# Patient Record
Sex: Female | Born: 1955
Health system: Southern US, Community
[De-identification: ages and names within clinical notes are randomized; demographics above are authoritative.]

## PROBLEM LIST (undated history)

## (undated) DIAGNOSIS — E039 Hypothyroidism, unspecified: Secondary | ICD-10-CM

## (undated) DIAGNOSIS — I10 Essential (primary) hypertension: Secondary | ICD-10-CM

## (undated) SURGERY — Surgical Case
Anesthesia: *Unknown

---

## 1999-04-14 ENCOUNTER — Encounter: Payer: Self-pay | Admitting: Obstetrics and Gynecology

## 1999-04-14 ENCOUNTER — Ambulatory Visit (HOSPITAL_COMMUNITY): Admission: RE | Admit: 1999-04-14 | Discharge: 1999-04-14 | Payer: Self-pay | Admitting: Obstetrics and Gynecology

## 1999-05-04 ENCOUNTER — Other Ambulatory Visit: Admission: RE | Admit: 1999-05-04 | Discharge: 1999-05-04 | Payer: Self-pay | Admitting: Obstetrics and Gynecology

## 2000-05-19 ENCOUNTER — Ambulatory Visit (HOSPITAL_COMMUNITY): Admission: RE | Admit: 2000-05-19 | Discharge: 2000-05-19 | Payer: Self-pay | Admitting: Obstetrics and Gynecology

## 2000-05-19 ENCOUNTER — Encounter: Payer: Self-pay | Admitting: Obstetrics and Gynecology

## 2000-06-08 ENCOUNTER — Other Ambulatory Visit: Admission: RE | Admit: 2000-06-08 | Discharge: 2000-06-08 | Payer: Self-pay | Admitting: Obstetrics and Gynecology

## 2002-05-02 ENCOUNTER — Ambulatory Visit (HOSPITAL_COMMUNITY): Admission: RE | Admit: 2002-05-02 | Discharge: 2002-05-02 | Payer: Self-pay | Admitting: Obstetrics and Gynecology

## 2002-05-02 ENCOUNTER — Encounter: Payer: Self-pay | Admitting: Obstetrics and Gynecology

## 2002-05-14 ENCOUNTER — Other Ambulatory Visit: Admission: RE | Admit: 2002-05-14 | Discharge: 2002-05-14 | Payer: Self-pay | Admitting: Obstetrics & Gynecology

## 2003-05-16 ENCOUNTER — Other Ambulatory Visit: Admission: RE | Admit: 2003-05-16 | Discharge: 2003-05-16 | Payer: Self-pay | Admitting: Obstetrics & Gynecology

## 2003-05-16 ENCOUNTER — Ambulatory Visit (HOSPITAL_COMMUNITY): Admission: RE | Admit: 2003-05-16 | Discharge: 2003-05-16 | Payer: Self-pay | Admitting: Obstetrics and Gynecology

## 2005-05-10 ENCOUNTER — Ambulatory Visit (HOSPITAL_COMMUNITY): Admission: RE | Admit: 2005-05-10 | Discharge: 2005-05-10 | Payer: Self-pay | Admitting: Obstetrics and Gynecology

## 2006-05-15 ENCOUNTER — Ambulatory Visit (HOSPITAL_COMMUNITY): Admission: RE | Admit: 2006-05-15 | Discharge: 2006-05-15 | Payer: Self-pay | Admitting: Obstetrics and Gynecology

## 2006-11-28 ENCOUNTER — Ambulatory Visit (HOSPITAL_COMMUNITY): Admission: RE | Admit: 2006-11-28 | Discharge: 2006-11-28 | Payer: Self-pay | Admitting: Family Medicine

## 2007-06-26 ENCOUNTER — Ambulatory Visit (HOSPITAL_COMMUNITY): Admission: RE | Admit: 2007-06-26 | Discharge: 2007-06-26 | Payer: Self-pay | Admitting: Obstetrics and Gynecology

## 2008-06-30 ENCOUNTER — Ambulatory Visit (HOSPITAL_COMMUNITY): Admission: RE | Admit: 2008-06-30 | Discharge: 2008-06-30 | Payer: Self-pay | Admitting: Obstetrics and Gynecology

## 2009-07-01 ENCOUNTER — Ambulatory Visit (HOSPITAL_COMMUNITY): Admission: RE | Admit: 2009-07-01 | Discharge: 2009-07-01 | Payer: Self-pay | Admitting: Obstetrics and Gynecology

## 2010-06-14 ENCOUNTER — Other Ambulatory Visit (HOSPITAL_COMMUNITY): Payer: Self-pay | Admitting: Obstetrics and Gynecology

## 2010-06-14 DIAGNOSIS — Z1231 Encounter for screening mammogram for malignant neoplasm of breast: Secondary | ICD-10-CM

## 2010-07-05 ENCOUNTER — Ambulatory Visit (HOSPITAL_COMMUNITY)
Admission: RE | Admit: 2010-07-05 | Discharge: 2010-07-05 | Disposition: A | Discharge status: Home / Self Care | Payer: 59 | Source: Ambulatory Visit | Attending: Obstetrics and Gynecology | Admitting: Obstetrics and Gynecology

## 2010-07-05 DIAGNOSIS — Z1231 Encounter for screening mammogram for malignant neoplasm of breast: Secondary | ICD-10-CM

## 2011-07-19 ENCOUNTER — Other Ambulatory Visit (HOSPITAL_COMMUNITY): Payer: Self-pay | Admitting: Obstetrics and Gynecology

## 2011-07-19 DIAGNOSIS — Z1231 Encounter for screening mammogram for malignant neoplasm of breast: Secondary | ICD-10-CM

## 2011-08-12 ENCOUNTER — Ambulatory Visit (HOSPITAL_COMMUNITY)
Admission: RE | Admit: 2011-08-12 | Discharge: 2011-08-12 | Disposition: A | Discharge status: Home / Self Care | Payer: 59 | Source: Ambulatory Visit | Attending: Obstetrics and Gynecology | Admitting: Obstetrics and Gynecology

## 2011-08-12 DIAGNOSIS — Z1231 Encounter for screening mammogram for malignant neoplasm of breast: Secondary | ICD-10-CM

## 2012-11-13 ENCOUNTER — Other Ambulatory Visit (HOSPITAL_COMMUNITY): Payer: Self-pay | Admitting: Obstetrics and Gynecology

## 2012-11-13 DIAGNOSIS — Z1231 Encounter for screening mammogram for malignant neoplasm of breast: Secondary | ICD-10-CM

## 2012-11-30 ENCOUNTER — Ambulatory Visit (HOSPITAL_COMMUNITY): Payer: 59

## 2012-12-03 ENCOUNTER — Ambulatory Visit (HOSPITAL_COMMUNITY)
Admission: RE | Admit: 2012-12-03 | Discharge: 2012-12-03 | Disposition: A | Discharge status: Home / Self Care | Payer: 59 | Source: Ambulatory Visit | Attending: Obstetrics and Gynecology | Admitting: Obstetrics and Gynecology

## 2012-12-03 DIAGNOSIS — Z1231 Encounter for screening mammogram for malignant neoplasm of breast: Secondary | ICD-10-CM

## 2013-02-18 ENCOUNTER — Other Ambulatory Visit: Payer: Self-pay | Admitting: Nurse Practitioner

## 2013-02-28 ENCOUNTER — Other Ambulatory Visit (HOSPITAL_COMMUNITY): Payer: Self-pay | Admitting: Obstetrics and Gynecology

## 2013-02-28 DIAGNOSIS — N951 Menopausal and female climacteric states: Secondary | ICD-10-CM

## 2013-03-07 ENCOUNTER — Ambulatory Visit (INDEPENDENT_AMBULATORY_CARE_PROVIDER_SITE_OTHER): Payer: 59 | Admitting: Nurse Practitioner

## 2013-03-07 ENCOUNTER — Encounter: Payer: Self-pay | Admitting: Nurse Practitioner

## 2013-03-07 VITALS — BP 122/82 | Ht 64.5 in | Wt 236.0 lb

## 2013-03-07 DIAGNOSIS — S40869A Insect bite (nonvenomous) of unspecified upper arm, initial encounter: Secondary | ICD-10-CM

## 2013-03-07 DIAGNOSIS — E039 Hypothyroidism, unspecified: Secondary | ICD-10-CM

## 2013-03-07 DIAGNOSIS — W57XXXA Bitten or stung by nonvenomous insect and other nonvenomous arthropods, initial encounter: Secondary | ICD-10-CM

## 2013-03-07 DIAGNOSIS — R609 Edema, unspecified: Secondary | ICD-10-CM

## 2013-03-07 DIAGNOSIS — Z0000 Encounter for general adult medical examination without abnormal findings: Secondary | ICD-10-CM

## 2013-03-07 MED ORDER — HYDROCHLOROTHIAZIDE 25 MG PO TABS: 25.0000 mg | ORAL_TABLET | Freq: Every day | ORAL | Status: DC

## 2013-03-07 MED ORDER — CLOBETASOL PROPIONATE 0.05 % EX CREA: 1.0000 "application " | TOPICAL_CREAM | Freq: Two times a day (BID) | CUTANEOUS | Status: DC

## 2013-03-08 ENCOUNTER — Ambulatory Visit (HOSPITAL_COMMUNITY)
Admission: RE | Admit: 2013-03-08 | Discharge: 2013-03-08 | Disposition: A | Discharge status: Home / Self Care | Payer: 59 | Source: Ambulatory Visit | Attending: Obstetrics and Gynecology | Admitting: Obstetrics and Gynecology

## 2013-03-08 DIAGNOSIS — Z13820 Encounter for screening for osteoporosis: Secondary | ICD-10-CM | POA: Insufficient documentation

## 2013-03-08 DIAGNOSIS — Z780 Asymptomatic menopausal state: Secondary | ICD-10-CM | POA: Insufficient documentation

## 2013-03-08 DIAGNOSIS — N951 Menopausal and female climacteric states: Secondary | ICD-10-CM

## 2013-03-11 ENCOUNTER — Encounter: Payer: Self-pay | Admitting: Nurse Practitioner

## 2013-03-11 LAB — TSH: TSH: 2.738 u[IU]/mL (ref 0.350–4.500)

## 2013-03-11 LAB — HEPATIC FUNCTION PANEL
ALK PHOS: 53 U/L (ref 39–117)
ALT: 19 U/L (ref 0–35)
AST: 20 U/L (ref 0–37)
Albumin: 4.1 g/dL (ref 3.5–5.2)
BILIRUBIN INDIRECT: 0.3 mg/dL (ref 0.2–1.2)
Bilirubin, Direct: 0.1 mg/dL (ref 0.0–0.3)
Total Bilirubin: 0.4 mg/dL (ref 0.2–1.2)
Total Protein: 6.4 g/dL (ref 6.0–8.3)

## 2013-03-11 LAB — BASIC METABOLIC PANEL
BUN: 16 mg/dL (ref 6–23)
CALCIUM: 9.2 mg/dL (ref 8.4–10.5)
CHLORIDE: 109 meq/L (ref 96–112)
CO2: 28 meq/L (ref 19–32)
CREATININE: 0.71 mg/dL (ref 0.50–1.10)
GLUCOSE: 99 mg/dL (ref 70–99)
POTASSIUM: 4.6 meq/L (ref 3.5–5.3)
Sodium: 143 mEq/L (ref 135–145)

## 2013-03-11 LAB — LIPID PANEL
CHOL/HDL RATIO: 2.9 ratio
Cholesterol: 153 mg/dL (ref 0–200)
HDL: 52 mg/dL (ref >39)
LDL CALC: 90 mg/dL (ref 0–99)
Triglycerides: 54 mg/dL (ref <150)
VLDL: 11 mg/dL (ref 0–40)

## 2013-03-11 NOTE — Progress Notes (Signed)
Subjective:  Presents for recheck. Gets PE with GYN. Has joined TOPS and a weight loss program at [facility]. Slight edema in the ankles at times. Has taken some old Lasix that she has. No CP, SOB or orthopnea. No cough. Also at end of visit mentions small pruritic area as site of previous tick bite, improved. No fever, headache or other rash.  Objective:   BP 122/82  Ht 5' 4.5" (1.638 m)  Wt 236 lb (107.049 kg)  BMI 39.90 kg/m2  LMP 06/28/2010 NAD. Alert, oriented. Lungs clear. Heart RRR. Lower extremities trace pitting edema. Slightly raised mildly erythematous nontender area at left posterior axillary line.  Assessment:  Problem List Items Addressed This Visit     Endocrine   Hypothyroidism - Primary   Relevant Orders      TSH     Other   Morbid obesity    Other Visit Diagnoses   Peripheral edema        Routine general medical examination at a health care facility        Relevant Orders       Basic metabolic panel       Hepatic function panel       Lipid panel    Tick bite of axillary region            Meds ordered this encounter  Medications  . DISCONTD: metroNIDAZOLE (FLAGYL) 500 MG tablet    Sig:   . FEMHRT LOW DOSE 0.5-2.5 MG-MCG per tablet    Sig:   . DISCONTD: nystatin-triamcinolone ointment (MYCOLOG)    Sig:   . Omega-3 Fatty Acids (FISH OIL PO)    Sig: Take by mouth daily.  . Cyanocobalamin (VITAMIN B 12 PO)    Sig: Take by mouth daily.  Marland Kitchen OVER THE COUNTER MEDICATION    Sig: Vitamin D one daily  . naproxen sodium (ANAPROX) 220 MG tablet    Sig: Take 220 mg by mouth 2 (two) times daily with a meal.  . aspirin 81 MG tablet    Sig: Take 81 mg by mouth daily.  . hydrochlorothiazide (HYDRODIURIL) 25 MG tablet    Sig: Take 1 tablet (25 mg total) by mouth daily. Prn swelling    Dispense:  90 tablet    Refill:  1    Order Specific Question:  Supervising Provider    Answer:  Mikey Kirschner [[id number]]  . clobetasol cream (TEMOVATE) 0.05 %    Sig: Apply 1  application topically 2 (two) times daily. Prn itchy rash up to 2 weeks at a time    Dispense:  30 g    Refill:  0    Order Specific Question:  Supervising Provider    Answer:  Mikey Kirschner [[id number]]  continue weight loss efforts. Low sodium diet. HCTZ as directed prn edema. Warning signs of tick fever reviewed.  Return in about 6 months (around 09/04/2013). Call back sooner if any problems.

## 2013-03-16 ENCOUNTER — Encounter: Payer: Self-pay | Admitting: Nurse Practitioner

## 2013-05-17 ENCOUNTER — Other Ambulatory Visit: Payer: Self-pay | Admitting: Family Medicine

## 2013-11-19 ENCOUNTER — Other Ambulatory Visit (HOSPITAL_COMMUNITY): Payer: Self-pay | Admitting: Obstetrics and Gynecology

## 2013-11-19 DIAGNOSIS — Z1231 Encounter for screening mammogram for malignant neoplasm of breast: Secondary | ICD-10-CM

## 2013-11-28 ENCOUNTER — Encounter: Payer: Self-pay | Admitting: Nurse Practitioner

## 2013-11-28 ENCOUNTER — Ambulatory Visit (INDEPENDENT_AMBULATORY_CARE_PROVIDER_SITE_OTHER): Payer: 59 | Admitting: Nurse Practitioner

## 2013-11-28 VITALS — BP 128/84 | Ht 64.5 in | Wt 242.2 lb

## 2013-11-28 DIAGNOSIS — E039 Hypothyroidism, unspecified: Secondary | ICD-10-CM

## 2013-11-28 DIAGNOSIS — R609 Edema, unspecified: Secondary | ICD-10-CM

## 2013-11-28 MED ORDER — HYDROCHLOROTHIAZIDE 25 MG PO TABS: 25.0000 mg | ORAL_TABLET | Freq: Every day | ORAL | Status: DC

## 2013-11-28 MED ORDER — LEVOTHYROXINE SODIUM 75 MCG PO TABS: ORAL_TABLET | ORAL | Status: DC

## 2013-11-29 ENCOUNTER — Encounter: Payer: Self-pay | Admitting: Nurse Practitioner

## 2013-11-29 NOTE — Progress Notes (Signed)
Subjective:  Presents for routine follow-up. Has been receiving physical therapy on her knee. Now riding a stationary bike about an hour a day. Having swelling in the lower legs about once every 2 weeks. Takes her fluid pill which helps. Gets regular preventive health physicals. Just had her TSH repeated with other blood work, not unavailable during office visit.  Objective:   BP 128/84 mmHg  Ht 5' 4.5" (1.638 m)  Wt 242 lb 3.2 oz (109.861 kg)  BMI 40.95 kg/m2  LMP 06/28/2010 NAD. Alert, oriented. Thyroid normal limit to palpation, no mass goiter or tenderness noted. Lungs clear. Heart regular rate rhythm. Lower extremities no edema.  Assessment:  Problem List Items Addressed This Visit      Endocrine   Hypothyroidism - Primary   Relevant Medications      levothyroxine (SYNTHROID, LEVOTHROID) tablet    Other Visit Diagnoses    Peripheral edema            Plan:  Meds ordered this encounter  Medications  . levothyroxine (SYNTHROID, LEVOTHROID) 75 MCG tablet    Sig: take 1 tablet by mouth once daily    Dispense:  90 tablet    Refill:  1    Order Specific Question:  Supervising Provider    Answer:  Mikey Kirschner [[id number]]  . hydrochlorothiazide (HYDRODIURIL) 25 MG tablet    Sig: Take 1 tablet (25 mg total) by mouth daily. Prn swelling    Dispense:  90 tablet    Refill:  0    Order Specific Question:  Supervising Provider    Answer:  Mikey Kirschner [[id number]]   Continue regular exercise and weight loss efforts. Return in about 1 year (around 11/29/2014).

## 2013-12-01 ENCOUNTER — Other Ambulatory Visit: Payer: Self-pay | Admitting: Family Medicine

## 2013-12-04 ENCOUNTER — Ambulatory Visit (HOSPITAL_COMMUNITY)
Admission: RE | Admit: 2013-12-04 | Discharge: 2013-12-04 | Disposition: A | Discharge status: Home / Self Care | Payer: 59 | Source: Ambulatory Visit | Attending: Obstetrics and Gynecology | Admitting: Obstetrics and Gynecology

## 2013-12-04 DIAGNOSIS — Z1231 Encounter for screening mammogram for malignant neoplasm of breast: Secondary | ICD-10-CM

## 2014-12-07 ENCOUNTER — Other Ambulatory Visit: Payer: Self-pay | Admitting: Family Medicine

## 2014-12-08 NOTE — Telephone Encounter (Signed)
Last office visit November 2015. May I refill

## 2014-12-08 NOTE — Telephone Encounter (Signed)
1 refill needs office visit 

## 2014-12-11 ENCOUNTER — Other Ambulatory Visit: Payer: Self-pay | Admitting: Nurse Practitioner

## 2014-12-11 ENCOUNTER — Other Ambulatory Visit: Payer: Self-pay | Admitting: Family Medicine

## 2014-12-11 ENCOUNTER — Other Ambulatory Visit: Payer: Self-pay | Admitting: *Deleted

## 2014-12-11 MED ORDER — HYDROCHLOROTHIAZIDE 25 MG PO TABS: 25.0000 mg | ORAL_TABLET | Freq: Every day | ORAL | Status: DC

## 2015-01-22 ENCOUNTER — Other Ambulatory Visit: Payer: Self-pay

## 2015-01-22 DIAGNOSIS — Z1231 Encounter for screening mammogram for malignant neoplasm of breast: Secondary | ICD-10-CM

## 2015-02-16 ENCOUNTER — Ambulatory Visit
Admission: RE | Admit: 2015-02-16 | Discharge: 2015-02-16 | Disposition: A | Discharge status: Home / Self Care | Payer: 59 | Source: Ambulatory Visit

## 2015-02-16 DIAGNOSIS — Z1231 Encounter for screening mammogram for malignant neoplasm of breast: Secondary | ICD-10-CM

## 2015-04-05 ENCOUNTER — Other Ambulatory Visit: Payer: Self-pay | Admitting: Family Medicine

## 2015-04-06 NOTE — Telephone Encounter (Signed)
30 day prescription needs office visit 

## 2015-04-29 ENCOUNTER — Encounter: Payer: Self-pay | Admitting: Nurse Practitioner

## 2015-04-29 ENCOUNTER — Ambulatory Visit (INDEPENDENT_AMBULATORY_CARE_PROVIDER_SITE_OTHER): Payer: Commercial Managed Care - HMO | Admitting: Nurse Practitioner

## 2015-04-29 VITALS — BP 122/74 | Ht 64.0 in | Wt 229.0 lb

## 2015-04-29 DIAGNOSIS — E039 Hypothyroidism, unspecified: Secondary | ICD-10-CM

## 2015-04-29 NOTE — Progress Notes (Signed)
Subjective:  Presents for recheck on her thyroid. Just had her preventive health physical. Compliant with medication. Had blood work done through another provider back in October. States it was all normal. Will get [place] a copy.  Objective:   BP 122/74 mmHg  Ht 5\' 4"  (1.626 m)  Wt 229 lb (103.874 kg)  BMI 39.29 kg/m2  LMP 06/28/2010 NAD. Alert, oriented. Lungs clear. Heart regular rate rhythm. Thyroid no masses or goiter noted, nontender to palpation.  Assessment:  Problem List Items Addressed This Visit      Endocrine   Hypothyroidism - Primary   Relevant Orders   TSH     Plan: Further refills on our medication based on test results. Return in about 1 year (around 04/28/2016) for recheck thyroid.

## 2015-04-30 ENCOUNTER — Other Ambulatory Visit: Payer: Self-pay | Admitting: Nurse Practitioner

## 2015-04-30 LAB — TSH: TSH: 2.08 u[IU]/mL (ref 0.450–4.500)

## 2015-04-30 MED ORDER — LEVOTHYROXINE SODIUM 75 MCG PO TABS: ORAL_TABLET | ORAL | Status: DC

## 2015-05-12 ENCOUNTER — Other Ambulatory Visit: Payer: Self-pay | Admitting: Obstetrics and Gynecology

## 2015-05-12 DIAGNOSIS — N951 Menopausal and female climacteric states: Secondary | ICD-10-CM

## 2015-06-19 ENCOUNTER — Ambulatory Visit
Admission: RE | Admit: 2015-06-19 | Discharge: 2015-06-19 | Disposition: A | Discharge status: Home / Self Care | Payer: 59 | Source: Ambulatory Visit | Attending: Obstetrics and Gynecology | Admitting: Obstetrics and Gynecology

## 2015-06-19 DIAGNOSIS — N951 Menopausal and female climacteric states: Secondary | ICD-10-CM

## 2016-02-02 DIAGNOSIS — M722 Plantar fascial fibromatosis: Secondary | ICD-10-CM | POA: Diagnosis not present

## 2016-02-04 DIAGNOSIS — M722 Plantar fascial fibromatosis: Secondary | ICD-10-CM | POA: Diagnosis not present

## 2016-02-08 DIAGNOSIS — M722 Plantar fascial fibromatosis: Secondary | ICD-10-CM | POA: Diagnosis not present

## 2016-02-12 DIAGNOSIS — M722 Plantar fascial fibromatosis: Secondary | ICD-10-CM | POA: Diagnosis not present

## 2016-02-15 DIAGNOSIS — M722 Plantar fascial fibromatosis: Secondary | ICD-10-CM | POA: Diagnosis not present

## 2016-02-18 DIAGNOSIS — M722 Plantar fascial fibromatosis: Secondary | ICD-10-CM | POA: Diagnosis not present

## 2016-05-04 ENCOUNTER — Telehealth: Payer: Self-pay | Admitting: Family Medicine

## 2016-05-04 NOTE — Telephone Encounter (Signed)
Patient is requesting order for blood work for appointment on 05/16/16 with Hoyle Sauer.

## 2016-05-09 ENCOUNTER — Other Ambulatory Visit: Payer: Self-pay | Admitting: Nurse Practitioner

## 2016-05-09 DIAGNOSIS — Z13220 Encounter for screening for lipoid disorders: Secondary | ICD-10-CM

## 2016-05-09 DIAGNOSIS — R5383 Other fatigue: Secondary | ICD-10-CM

## 2016-05-09 DIAGNOSIS — E039 Hypothyroidism, unspecified: Secondary | ICD-10-CM

## 2016-05-09 NOTE — Telephone Encounter (Signed)
Left message return call 05/09/2016

## 2016-05-09 NOTE — Telephone Encounter (Signed)
Labs ordered.

## 2016-05-09 NOTE — Telephone Encounter (Signed)
Spoke with patient and informed her per [place] have been ordered for upcoming appointment. Patient verbalized understanding.

## 2016-05-10 DIAGNOSIS — Z13220 Encounter for screening for lipoid disorders: Secondary | ICD-10-CM | POA: Diagnosis not present

## 2016-05-10 DIAGNOSIS — R5383 Other fatigue: Secondary | ICD-10-CM | POA: Diagnosis not present

## 2016-05-10 DIAGNOSIS — E039 Hypothyroidism, unspecified: Secondary | ICD-10-CM | POA: Diagnosis not present

## 2016-05-11 ENCOUNTER — Other Ambulatory Visit: Payer: Self-pay | Admitting: Nurse Practitioner

## 2016-05-11 LAB — BASIC METABOLIC PANEL
BUN/Creatinine Ratio: 21 (ref 12–28)
BUN: 15 mg/dL (ref 8–27)
CO2: 25 mmol/L (ref 18–29)
CREATININE: 0.71 mg/dL (ref 0.57–1.00)
Calcium: 9.2 mg/dL (ref 8.7–10.3)
Chloride: 101 mmol/L (ref 96–106)
GFR calc Af Amer: 107 mL/min/{1.73_m2} (ref >59)
GFR, EST NON AFRICAN AMERICAN: 93 mL/min/{1.73_m2} (ref >59)
Glucose: 74 mg/dL (ref 65–99)
POTASSIUM: 4.4 mmol/L (ref 3.5–5.2)
Sodium: 139 mmol/L (ref 134–144)

## 2016-05-11 LAB — LIPID PANEL
CHOL/HDL RATIO: 2.7 ratio (ref 0.0–4.4)
Cholesterol, Total: 179 mg/dL (ref 100–199)
HDL: 67 mg/dL (ref >39)
LDL CALC: 101 mg/dL — AB (ref 0–99)
TRIGLYCERIDES: 55 mg/dL (ref 0–149)
VLDL CHOLESTEROL CAL: 11 mg/dL (ref 5–40)

## 2016-05-11 LAB — HEPATIC FUNCTION PANEL
ALBUMIN: 4.5 g/dL (ref 3.6–4.8)
ALK PHOS: 65 IU/L (ref 39–117)
ALT: 21 IU/L (ref 0–32)
AST: 24 IU/L (ref 0–40)
BILIRUBIN, DIRECT: 0.14 mg/dL (ref 0.00–0.40)
Bilirubin Total: 0.5 mg/dL (ref 0.0–1.2)
TOTAL PROTEIN: 6.8 g/dL (ref 6.0–8.5)

## 2016-05-11 LAB — TSH: TSH: 2.13 u[IU]/mL (ref 0.450–4.500)

## 2016-05-11 LAB — VITAMIN D 25 HYDROXY (VIT D DEFICIENCY, FRACTURES): Vit D, 25-Hydroxy: 40 ng/mL (ref 30.0–100.0)

## 2016-05-16 ENCOUNTER — Encounter: Payer: Self-pay | Admitting: Nurse Practitioner

## 2016-05-16 ENCOUNTER — Ambulatory Visit (INDEPENDENT_AMBULATORY_CARE_PROVIDER_SITE_OTHER): Payer: Commercial Managed Care - HMO | Admitting: Nurse Practitioner

## 2016-05-16 VITALS — BP 128/82 | Ht 64.0 in | Wt 249.8 lb

## 2016-05-16 DIAGNOSIS — E039 Hypothyroidism, unspecified: Secondary | ICD-10-CM | POA: Diagnosis not present

## 2016-05-16 MED ORDER — HYDROCHLOROTHIAZIDE 25 MG PO TABS: 25.0000 mg | ORAL_TABLET | Freq: Every day | ORAL | 1 refills | Status: DC

## 2016-05-16 MED ORDER — LEVOTHYROXINE SODIUM 75 MCG PO TABS: 75.0000 ug | ORAL_TABLET | Freq: Every day | ORAL | 3 refills | Status: DC

## 2016-05-16 NOTE — Progress Notes (Signed)
Subjective:  Presents for recheck on her thyroid. Compliant with medication. Also needs to review her labs. Limited activity and weight gain over the past few months which she relates to her brother's illness and death in 2023-02-18 and severe plantar fasciitis which is finally improving. Does ride her stationary bike which helps knee pain. Gets regular preventive health physicals and mammograms at [place]. Takes an occasional HCTZ only for edema.   Objective:   BP 128/82   Ht 5\' 4"  (1.626 m)   Wt 249 lb 12.8 oz (113.3 kg)   LMP 06/28/2010   BMI 42.88 kg/m  NAD. Alert, oriented. Lungs clear. Heart RRR. Thyroid nontender to palpation; no obvious masses.  Reviewed labs with patient from 05/10/16.  Assessment:   Problem List Items Addressed This Visit      Endocrine   Hypothyroidism - Primary   Relevant Medications   levothyroxine (SYNTHROID, LEVOTHROID) 75 MCG tablet       Plan:   Meds ordered this encounter  Medications  . levothyroxine (SYNTHROID, LEVOTHROID) 75 MCG tablet    Sig: Take 1 tablet (75 mcg total) by mouth daily.    Dispense:  90 tablet    Refill:  3    Order Specific Question:   Supervising Provider    Answer:   Mikey Kirschner [[id number]]  . hydrochlorothiazide (HYDRODIURIL) 25 MG tablet    Sig: Take 1 tablet (25 mg total) by mouth daily. Prn swelling    Dispense:  90 tablet    Refill:  1    Order Specific Question:   Supervising Provider    Answer:   Mikey Kirschner [[id number]]   Continue current medications. Encouraged healthy diet, regular activity and weight loss. Continue daily vitamin D.  Return in about 6 months (around 11/15/2016) for recheck.

## 2016-10-14 DIAGNOSIS — M25561 Pain in right knee: Secondary | ICD-10-CM | POA: Diagnosis not present

## 2016-11-14 ENCOUNTER — Ambulatory Visit: Payer: Commercial Managed Care - HMO | Admitting: Nurse Practitioner

## 2017-01-25 ENCOUNTER — Other Ambulatory Visit: Payer: Self-pay | Admitting: Obstetrics and Gynecology

## 2017-01-25 DIAGNOSIS — Z1231 Encounter for screening mammogram for malignant neoplasm of breast: Secondary | ICD-10-CM

## 2017-01-30 DIAGNOSIS — M25561 Pain in right knee: Secondary | ICD-10-CM | POA: Diagnosis not present

## 2017-01-30 DIAGNOSIS — M21061 Valgus deformity, not elsewhere classified, right knee: Secondary | ICD-10-CM | POA: Diagnosis not present

## 2017-01-30 DIAGNOSIS — M1711 Unilateral primary osteoarthritis, right knee: Secondary | ICD-10-CM | POA: Diagnosis not present

## 2017-02-07 DIAGNOSIS — M1711 Unilateral primary osteoarthritis, right knee: Secondary | ICD-10-CM | POA: Diagnosis not present

## 2017-02-07 DIAGNOSIS — M25561 Pain in right knee: Secondary | ICD-10-CM | POA: Diagnosis not present

## 2017-02-15 ENCOUNTER — Ambulatory Visit
Admission: RE | Admit: 2017-02-15 | Discharge: 2017-02-15 | Disposition: A | Discharge status: Home / Self Care | Payer: 59 | Source: Ambulatory Visit | Attending: Obstetrics and Gynecology | Admitting: Obstetrics and Gynecology

## 2017-02-15 DIAGNOSIS — Z1231 Encounter for screening mammogram for malignant neoplasm of breast: Secondary | ICD-10-CM

## 2017-02-16 DIAGNOSIS — M25561 Pain in right knee: Secondary | ICD-10-CM | POA: Diagnosis not present

## 2017-02-16 DIAGNOSIS — M1711 Unilateral primary osteoarthritis, right knee: Secondary | ICD-10-CM | POA: Diagnosis not present

## 2017-06-07 ENCOUNTER — Other Ambulatory Visit: Payer: Self-pay | Admitting: Nurse Practitioner

## 2017-07-11 ENCOUNTER — Other Ambulatory Visit: Payer: Self-pay | Admitting: Nurse Practitioner

## 2017-07-11 ENCOUNTER — Telehealth: Payer: Self-pay | Admitting: Nurse Practitioner

## 2017-07-11 DIAGNOSIS — E039 Hypothyroidism, unspecified: Secondary | ICD-10-CM

## 2017-07-11 DIAGNOSIS — Z79899 Other long term (current) drug therapy: Secondary | ICD-10-CM

## 2017-07-11 DIAGNOSIS — Z0000 Encounter for general adult medical examination without abnormal findings: Secondary | ICD-10-CM

## 2017-07-11 DIAGNOSIS — R5383 Other fatigue: Secondary | ICD-10-CM

## 2017-07-11 NOTE — Telephone Encounter (Signed)
Patient is aware 

## 2017-07-11 NOTE — Telephone Encounter (Signed)
Met 7, lipid, liver and TSH. Thanks.

## 2017-07-11 NOTE — Telephone Encounter (Signed)
Labs placed in epic. Left message to return call

## 2017-07-11 NOTE — Telephone Encounter (Signed)
Patient has an appointment on 07/21/17 with Hoyle Sauer.  She is requesting orders for labs.

## 2017-07-11 NOTE — Telephone Encounter (Signed)
Last labs from one year ago. Vit D, TSH, LIPID, HEP, BMP

## 2017-07-12 DIAGNOSIS — Z0000 Encounter for general adult medical examination without abnormal findings: Secondary | ICD-10-CM | POA: Diagnosis not present

## 2017-07-12 DIAGNOSIS — Z79899 Other long term (current) drug therapy: Secondary | ICD-10-CM | POA: Diagnosis not present

## 2017-07-12 DIAGNOSIS — E039 Hypothyroidism, unspecified: Secondary | ICD-10-CM | POA: Diagnosis not present

## 2017-07-13 DIAGNOSIS — M1711 Unilateral primary osteoarthritis, right knee: Secondary | ICD-10-CM | POA: Diagnosis not present

## 2017-07-13 DIAGNOSIS — G8929 Other chronic pain: Secondary | ICD-10-CM | POA: Diagnosis not present

## 2017-07-13 LAB — BASIC METABOLIC PANEL
BUN/Creatinine Ratio: 26 (ref 12–28)
BUN: 20 mg/dL (ref 8–27)
CO2: 25 mmol/L (ref 20–29)
Calcium: 9.6 mg/dL (ref 8.7–10.3)
Chloride: 104 mmol/L (ref 96–106)
Creatinine, Ser: 0.76 mg/dL (ref 0.57–1.00)
GFR calc Af Amer: 98 mL/min/{1.73_m2} (ref >59)
GFR calc non Af Amer: 85 mL/min/{1.73_m2} (ref >59)
GLUCOSE: 76 mg/dL (ref 65–99)
POTASSIUM: 4.3 mmol/L (ref 3.5–5.2)
SODIUM: 143 mmol/L (ref 134–144)

## 2017-07-13 LAB — LIPID PANEL
Chol/HDL Ratio: 2.5 ratio (ref 0.0–4.4)
Cholesterol, Total: 165 mg/dL (ref 100–199)
HDL: 65 mg/dL (ref >39)
LDL CALC: 86 mg/dL (ref 0–99)
Triglycerides: 68 mg/dL (ref 0–149)
VLDL CHOLESTEROL CAL: 14 mg/dL (ref 5–40)

## 2017-07-13 LAB — HEPATIC FUNCTION PANEL
ALK PHOS: 71 IU/L (ref 39–117)
ALT: 24 IU/L (ref 0–32)
AST: 23 IU/L (ref 0–40)
Albumin: 4.3 g/dL (ref 3.6–4.8)
BILIRUBIN TOTAL: 0.4 mg/dL (ref 0.0–1.2)
BILIRUBIN, DIRECT: 0.13 mg/dL (ref 0.00–0.40)
Total Protein: 6.9 g/dL (ref 6.0–8.5)

## 2017-07-13 LAB — TSH: TSH: 3.26 u[IU]/mL (ref 0.450–4.500)

## 2017-07-19 DIAGNOSIS — Z01419 Encounter for gynecological examination (general) (routine) without abnormal findings: Secondary | ICD-10-CM | POA: Diagnosis not present

## 2017-07-21 ENCOUNTER — Ambulatory Visit: Payer: 59 | Admitting: Nurse Practitioner

## 2017-07-21 ENCOUNTER — Encounter: Payer: Self-pay | Admitting: Nurse Practitioner

## 2017-07-21 VITALS — BP 134/78 | Ht 64.0 in | Wt 233.8 lb

## 2017-07-21 DIAGNOSIS — M8580 Other specified disorders of bone density and structure, unspecified site: Secondary | ICD-10-CM | POA: Diagnosis not present

## 2017-07-21 DIAGNOSIS — Z780 Asymptomatic menopausal state: Secondary | ICD-10-CM

## 2017-07-21 DIAGNOSIS — Z01818 Encounter for other preprocedural examination: Secondary | ICD-10-CM | POA: Diagnosis not present

## 2017-07-21 DIAGNOSIS — E039 Hypothyroidism, unspecified: Secondary | ICD-10-CM | POA: Diagnosis not present

## 2017-07-21 MED ORDER — HYDROCHLOROTHIAZIDE 25 MG PO TABS: ORAL_TABLET | ORAL | 1 refills | Status: DC

## 2017-07-21 MED ORDER — LEVOTHYROXINE SODIUM 75 MCG PO TABS
75.0000 ug | ORAL_TABLET | Freq: Every day | ORAL | 3 refills | Status: DC
Start: 2017-07-21 — End: 2018-08-06

## 2017-07-21 NOTE — Patient Instructions (Signed)
Results for orders placed or performed in visit on 07/11/17  TSH  Result Value Ref Range   TSH 3.260 0.450 - 4.500 uIU/mL  Hepatic function panel  Result Value Ref Range   Total Protein 6.9 6.0 - 8.5 g/dL   Albumin 4.3 3.6 - 4.8 g/dL   Bilirubin Total 0.4 0.0 - 1.2 mg/dL   Bilirubin, Direct 0.13 0.00 - 0.40 mg/dL   Alkaline Phosphatase 71 39 - 117 IU/L   AST 23 0 - 40 IU/L   ALT 24 0 - 32 IU/L  Lipid Profile  Result Value Ref Range   Cholesterol, Total 165 100 - 199 mg/dL   Triglycerides 68 0 - 149 mg/dL   HDL 65 >39 mg/dL   VLDL Cholesterol Cal 14 5 - 40 mg/dL   LDL Calculated 86 0 - 99 mg/dL   Chol/HDL Ratio 2.5 0.0 - 4.4 ratio  Basic Metabolic Panel (BMET)  Result Value Ref Range   Glucose 76 65 - 99 mg/dL   BUN 20 8 - 27 mg/dL   Creatinine, Ser 0.76 0.57 - 1.00 mg/dL   GFR calc non Af Amer 85 >59 mL/min/1.73   GFR calc Af Amer 98 >59 mL/min/1.73   BUN/Creatinine Ratio 26 12 - 28   Sodium 143 134 - 144 mmol/L   Potassium 4.3 3.5 - 5.2 mmol/L   Chloride 104 96 - 106 mmol/L   CO2 25 20 - 29 mmol/L   Calcium 9.6 8.7 - 10.3 mg/dL

## 2017-07-22 ENCOUNTER — Encounter: Payer: Self-pay | Admitting: Nurse Practitioner

## 2017-07-22 LAB — CBC WITH DIFFERENTIAL/PLATELET
BASOS ABS: 0 10*3/uL (ref 0.0–0.2)
Basos: 0 %
EOS (ABSOLUTE): 0.3 10*3/uL (ref 0.0–0.4)
Eos: 4 %
Hematocrit: 39.3 % (ref 34.0–46.6)
Hemoglobin: 13.6 g/dL (ref 11.1–15.9)
IMMATURE GRANULOCYTES: 0 %
Immature Grans (Abs): 0 10*3/uL (ref 0.0–0.1)
LYMPHS ABS: 2 10*3/uL (ref 0.7–3.1)
Lymphs: 27 %
MCH: 31.1 pg (ref 26.6–33.0)
MCHC: 34.6 g/dL (ref 31.5–35.7)
MCV: 90 fL (ref 79–97)
MONOS ABS: 0.4 10*3/uL (ref 0.1–0.9)
Monocytes: 6 %
NEUTROS PCT: 63 %
Neutrophils Absolute: 4.4 10*3/uL (ref 1.4–7.0)
PLATELETS: 222 10*3/uL (ref 150–450)
RBC: 4.38 x10E6/uL (ref 3.77–5.28)
RDW: 13.6 % (ref 12.3–15.4)
WBC: 7.2 10*3/uL (ref 3.4–10.8)

## 2017-07-22 NOTE — Progress Notes (Signed)
Subjective:  Presents for recheck for hypothyroidism. States her top weight was 257 lbs. Has done much better with her diet. Exercise has been limited due to knee pain. Has surgery scheduled. Needs CBC today for pre op. Plans to slowly increase activity after this. No difficulty swallowing.  Depression screen [facility] 2/9 07/21/2017  Decreased Interest 0  Down, Depressed, Hopeless 0  PHQ - 2 Score 0     Objective:   BP 134/78   Ht 5\' 4"  (1.626 m)   Wt 233 lb 12.8 oz (106.1 kg)   LMP 06/28/2010   BMI 40.13 kg/m  NAD. Alert, oriented. Thyroid non tender to palpation. No mass or goiter noted. Lungs clear. Heart RRR.  Recent Results (from the past 2160 hour(s))  TSH     Status: None   Collection Time: 07/12/17  8:58 AM  Result Value Ref Range   TSH 3.260 0.450 - 4.500 uIU/mL  Hepatic function panel     Status: None   Collection Time: 07/12/17  8:58 AM  Result Value Ref Range   Total Protein 6.9 6.0 - 8.5 g/dL   Albumin 4.3 3.6 - 4.8 g/dL   Bilirubin Total 0.4 0.0 - 1.2 mg/dL   Bilirubin, Direct 0.13 0.00 - 0.40 mg/dL   Alkaline Phosphatase 71 39 - 117 IU/L   AST 23 0 - 40 IU/L   ALT 24 0 - 32 IU/L  Lipid Profile     Status: None   Collection Time: 07/12/17  8:58 AM  Result Value Ref Range   Cholesterol, Total 165 100 - 199 mg/dL   Triglycerides 68 0 - 149 mg/dL   HDL 65 >39 mg/dL   VLDL Cholesterol Cal 14 5 - 40 mg/dL   LDL Calculated 86 0 - 99 mg/dL   Chol/HDL Ratio 2.5 0.0 - 4.4 ratio    Comment:                                   T. Chol/HDL Ratio                                             Men  Women                               1/2 Avg.Risk  3.4    3.3                                   Avg.Risk  5.0    4.4                                2X Avg.Risk  9.6    7.1                                3X Avg.Risk 23.4   35.0   Basic Metabolic Panel (BMET)     Status: None   Collection Time: 07/12/17  8:58 AM  Result Value Ref Range   Glucose 76 65 - 99 mg/dL   BUN 20 8 - 27 mg/dL  Creatinine, Ser 0.76 0.57 - 1.00 mg/dL   GFR calc non Af Amer 85 >59 mL/min/1.73   GFR calc Af Amer 98 >59 mL/min/1.73   BUN/Creatinine Ratio 26 12 - 28   Sodium 143 134 - 144 mmol/L   Potassium 4.3 3.5 - 5.2 mmol/L   Chloride 104 96 - 106 mmol/L   CO2 25 20 - 29 mmol/L   Calcium 9.6 8.7 - 10.3 mg/dL  CBC with Differential/Platelet     Status: None   Collection Time: 07/21/17 11:40 AM  Result Value Ref Range   WBC 7.2 3.4 - 10.8 x10E3/uL   RBC 4.38 3.77 - 5.28 x10E6/uL   Hemoglobin 13.6 11.1 - 15.9 g/dL   Hematocrit 39.3 34.0 - 46.6 %   MCV 90 79 - 97 fL   MCH 31.1 26.6 - 33.0 pg   MCHC 34.6 31.5 - 35.7 g/dL   RDW 13.6 12.3 - 15.4 %   Platelets 222 150 - 450 x10E3/uL   Neutrophils 63 Not Estab. %   Lymphs 27 Not Estab. %   Monocytes 6 Not Estab. %   Eos 4 Not Estab. %   Basos 0 Not Estab. %   Neutrophils Absolute 4.4 1.4 - 7.0 x10E3/uL   Lymphocytes Absolute 2.0 0.7 - 3.1 x10E3/uL   Monocytes Absolute 0.4 0.1 - 0.9 x10E3/uL   EOS (ABSOLUTE) 0.3 0.0 - 0.4 x10E3/uL   Basophils Absolute 0.0 0.0 - 0.2 x10E3/uL   Immature Granulocytes 0 Not Estab. %   Immature Grans (Abs) 0.0 0.0 - 0.1 x10E3/uL   Reviewed labs with patient.   Assessment:   Problem List Items Addressed This Visit      Endocrine   Hypothyroidism - Primary   Relevant Medications   levothyroxine (SYNTHROID, LEVOTHROID) 75 MCG tablet    Other Visit Diagnoses    Preop testing       Relevant Orders   CBC with Differential/Platelet (Completed)   Post-menopausal       Relevant Orders   DG Bone Density   Osteopenia, unspecified location       Relevant Orders   DG Bone Density       Plan:   Meds ordered this encounter  Medications  . hydrochlorothiazide (HYDRODIURIL) 25 MG tablet    Sig: TAKE 1 TABLET DAILY AS NEEDED FOR SWELLING    Dispense:  90 tablet    Refill:  1    Order Specific Question:   Supervising Provider    Answer:   Mikey Kirschner [[id number]]  . levothyroxine (SYNTHROID, LEVOTHROID)  75 MCG tablet    Sig: Take 1 tablet (75 mcg total) by mouth daily.    Dispense:  90 tablet    Refill:  3    Order Specific Question:   Supervising Provider    Answer:   Mikey Kirschner [[id number]]   Takes occasional HCTZ for swelling.  Continue current meds.  DEXA ordered.  Return in about 1 year (around 07/22/2018) for recheck.

## 2017-07-24 ENCOUNTER — Telehealth: Payer: Self-pay | Admitting: Family Medicine

## 2017-07-24 NOTE — Telephone Encounter (Signed)
Patient dropped off pre-operative clearance form needing to be completed and faxed.  See on desk.

## 2017-07-24 NOTE — Telephone Encounter (Signed)
Done. Given to nurses. Please attach CBC report and give to [facility] to fax. Thanks.

## 2017-08-14 ENCOUNTER — Other Ambulatory Visit: Payer: Self-pay | Admitting: Orthopedic Surgery

## 2017-09-04 ENCOUNTER — Other Ambulatory Visit: Payer: Self-pay | Admitting: Family Medicine

## 2017-09-04 ENCOUNTER — Ambulatory Visit
Admission: RE | Admit: 2017-09-04 | Discharge: 2017-09-04 | Disposition: A | Discharge status: Home / Self Care | Payer: 59 | Source: Ambulatory Visit | Attending: Nurse Practitioner | Admitting: Nurse Practitioner

## 2017-09-04 ENCOUNTER — Encounter: Payer: Self-pay | Admitting: Family Medicine

## 2017-09-04 DIAGNOSIS — M85851 Other specified disorders of bone density and structure, right thigh: Secondary | ICD-10-CM | POA: Diagnosis not present

## 2017-09-04 DIAGNOSIS — Z780 Asymptomatic menopausal state: Secondary | ICD-10-CM | POA: Diagnosis not present

## 2017-09-04 DIAGNOSIS — M8580 Other specified disorders of bone density and structure, unspecified site: Secondary | ICD-10-CM

## 2017-10-02 DIAGNOSIS — G8918 Other acute postprocedural pain: Secondary | ICD-10-CM | POA: Diagnosis not present

## 2017-10-02 DIAGNOSIS — M1711 Unilateral primary osteoarthritis, right knee: Secondary | ICD-10-CM | POA: Diagnosis not present

## 2017-10-02 DIAGNOSIS — M1712 Unilateral primary osteoarthritis, left knee: Secondary | ICD-10-CM | POA: Diagnosis not present

## 2017-10-06 ENCOUNTER — Telehealth (HOSPITAL_COMMUNITY): Payer: Self-pay | Admitting: Physical Therapy

## 2017-10-06 ENCOUNTER — Ambulatory Visit (HOSPITAL_COMMUNITY): Payer: 59 | Admitting: Physical Therapy

## 2017-10-06 ENCOUNTER — Encounter (HOSPITAL_COMMUNITY): Payer: Self-pay

## 2017-10-06 NOTE — Telephone Encounter (Signed)
MD order states not to start PT until 9/18 - pt can not come on 9/18 r/s for 10/13/17 for eval. NF 10/06/2017

## 2017-10-10 DIAGNOSIS — Z96651 Presence of right artificial knee joint: Secondary | ICD-10-CM | POA: Diagnosis not present

## 2017-10-11 ENCOUNTER — Encounter (HOSPITAL_COMMUNITY): Payer: 59

## 2017-10-11 ENCOUNTER — Ambulatory Visit (HOSPITAL_COMMUNITY): Payer: 59

## 2017-10-11 ENCOUNTER — Encounter (HOSPITAL_COMMUNITY): Payer: Self-pay

## 2017-10-13 ENCOUNTER — Encounter (HOSPITAL_COMMUNITY): Payer: Self-pay

## 2017-10-13 ENCOUNTER — Other Ambulatory Visit: Payer: Self-pay

## 2017-10-13 ENCOUNTER — Ambulatory Visit (HOSPITAL_COMMUNITY): Payer: 59 | Attending: Orthopedic Surgery

## 2017-10-13 DIAGNOSIS — M25661 Stiffness of right knee, not elsewhere classified: Secondary | ICD-10-CM | POA: Diagnosis not present

## 2017-10-13 DIAGNOSIS — M6281 Muscle weakness (generalized): Secondary | ICD-10-CM | POA: Insufficient documentation

## 2017-10-13 DIAGNOSIS — M25561 Pain in right knee: Secondary | ICD-10-CM

## 2017-10-13 DIAGNOSIS — R600 Localized edema: Secondary | ICD-10-CM

## 2017-10-13 NOTE — Patient Instructions (Signed)
Access Code: [email or web address]  URL: [email or web address]  Date: 10/13/2017  Prepared by: Geraldine Solar   Exercises Supine Quadricep Sets - 10 reps - 3 sets - 5-10 hold - 1-2x daily - 7x weekly Supine Heel Slide with Strap - 10 reps - 3 sets - 5-10 hold - 1-2x daily - 7x weekly Supine Hamstring Stretch with Strap - 3-5 reps - 30-60seconds hold - 1x daily - 7x weekly

## 2017-10-13 NOTE — Therapy (Signed)
[place], [place], [address] Phone: [phone]   Fax:  [phone]  Physical Therapy Evaluation  Patient Details  Name: Kristen Drake MRN: [id number] Date of Birth: [birth year] Referring Provider: Vickey Huger, MD   Encounter Date: 10/13/2017  PT End of Session - 10/13/17 1336    Visit Number  1    Number of Visits  13    Date for PT Re-Evaluation  11/10/17    Authorization Type  [employer]    Authorization Time Period  10/13/17 to 11/10/17    Authorization - Visit Number  2    Authorization - Number of Visits  60    PT Start Time  0623    PT Stop Time  1330    PT Time Calculation (min)  32 min    Activity Tolerance  Patient tolerated treatment well;No increased pain    Behavior During Therapy  [facility] for tasks assessed/performed       History reviewed. No pertinent past medical history.  History reviewed. No pertinent surgical history.  There were no vitals filed for this visit.   Subjective Assessment - 10/13/17 1302    Subjective  Pt reports undergoing R partial medial knee replacement on 10/02/17 by Dr. Vickey Huger. Pain and arthritis is what led to her surgery. She was not using an AD prior but she is using a SPC currently. She did not have any HHPT, she just used the CPM for 8hrs/day and the bone foam for extension 42mins 4x/day; she is to use both of these for 2 weeks post-op so she will stop on Monday. She is currently having the most difficulty with her swelling. She feels like her flexion is going well. She wants to improve her walking.    Limitations  Walking;House hold activities    How long can you sit comfortably?  no issues    How long can you stand comfortably?  30 mins    How long can you walk comfortably?  30 mins    Patient Stated Goals  walk without pain    Currently in Pain?  No/denies         [facility] PT Assessment - 10/13/17 0001      Assessment   Medical Diagnosis  R uni TKA    Referring Provider  Vickey Huger, MD    Onset Date/Surgical Date  10/02/17    Next MD Visit  11/07/17    Prior Therapy  none      Balance Screen   Has the patient fallen in the past 6 months  No    Has the patient had a decrease in activity level because of a fear of falling?   No    Is the patient reluctant to leave their home because of a fear of falling?   No      Prior Function   Level of Independence  Independent    Vocation  Retired    Leisure  Teaching laboratory technician, play golf      Observation/Other Assessments   Focus on Therapeutic Outcomes (FOTO)   55% limitation      Observation/Other Assessments-Edema    Edema  Circumferential      Circumferential Edema   Circumferential - Right  50cm, joint line    Circumferential - Left   48.5cm, joint line      ROM / Strength   AROM / PROM / Strength  AROM;Strength      AROM  AROM Assessment Site  Knee    Right/Left Knee  Right    Right Knee Extension  8    Right Knee Flexion  92      Strength   Strength Assessment Site  Hip;Knee;Ankle    Right Hip Flexion  4/5    Right Hip Extension  4/5    Right Hip ABduction  4-/5    Left Hip Flexion  4+/5    Left Hip Extension  4/5    Left Hip ABduction  4/5    Right Knee Flexion  4-/5    Right Knee Extension  4-/5    Left Knee Flexion  5/5    Left Knee Extension  5/5    Right Ankle Dorsiflexion  4+/5    Left Ankle Dorsiflexion  4+/5      Palpation   Patella mobility  hypomobile all directions    Palpation comment  tenderness along medial joint line > lateral      Ambulation/Gait   Ambulation Distance (Feet)  472 Feet   3MWT   Assistive device  Straight cane    Gait Pattern  Step-through pattern;Decreased stance time - right;Antalgic;Trendelenburg      Balance   Balance Assessed  Yes      Static Standing Balance   Static Standing - Balance Support  No upper extremity supported    Static Standing Balance -  Activities   Single Leg Stance - Right Leg;Single Leg Stance  - Left Leg    Static Standing - Comment/# of Minutes  R: 25sec L: 32.8sec      Standardized Balance Assessment   Standardized Balance Assessment  Five Times Sit to Stand    Five times sit to stand comments   12.4, chair, no UE, RLE extended           Objective measurements completed on examination: See above findings.         PT Education - 10/13/17 1336    Education Details  exam findings, POC, HEP    Person(s) Educated  Patient    Methods  Explanation;Handout;Demonstration    Comprehension  Verbalized understanding       PT Short Term Goals - 10/13/17 1619      PT SHORT TERM GOAL #1   Title  Pt will have improved R knee AROM from 5-105deg in order to decrease pain and maximize gait.     Time  2    Period  Weeks    Status  New    Target Date  10/27/17      PT SHORT TERM GOAL #2   Title  Pt will have decreased joint line edema by 1cm or > in order to maximize ROM and decrease pain.    Time  2    Period  Weeks    Status  New      PT SHORT TERM GOAL #3   Title  Pt will have improved 5xSTS to 10sec or < with proper form and without UE to demo improved functional hip strength and maximize her transfers.2    Time  2    Period  Weeks    Status  New        PT Long Term Goals - 10/13/17 1619      PT LONG TERM GOAL #1   Title  Pt will have improved R knee AROM from 0-115deg in order to further maximize gait and stair ambulation.    Time  4    Period  Weeks    Status  New    Target Date  11/10/17      PT LONG TERM GOAL #2   Title  Pt will have improved MMT by 1 grade throughout in order to maximize functional mobility and return to PLOF    Time  4    Period  Weeks    Status  New      PT LONG TERM GOAL #3   Title  Pt will be able to ambulate at least 678ft during 3MWT without AD and gait WFL in order to maximize her return to community ambulation.    Time  4    Period  Weeks    Status  New      PT LONG TERM GOAL #4   Title  Pt will be able to perform bil  SLS for 60 sec or > in order to demo improved core and hip strength as well as balance in order to maximize her stair ambulation and gait on uneven ground.     Time  4    Period  Weeks    Status  New             Plan - 10/13/17 1617    Clinical Impression Statement  Pt is pleasant 62 YO F who presents to OPPT s/p R unilateral TKA by Dr. Vickey Huger on 10/02/17. Pt has post-op deficits in edema, ROM, MMT, SLS, functional strength, and functional mobility. Her AROM was 8-92deg and she was noted to have approximately 2cm of swelling at joint line. Pt needs skilled PT intervention to address these impairments in order to maximize AROM and promote return to PLOF.     Clinical Presentation  Stable    Clinical Presentation due to:  edema, ROM, MMT, SLS, 3MWT, 5xSTS, funcitonal strength, functional mobility    Clinical Decision Making  Low    Rehab Potential  Excellent    PT Frequency  3x / week    PT Duration  4 weeks    PT Treatment/Interventions  ADLs/Self Care Home Management;Aquatic Therapy;Cryotherapy;Electrical Stimulation;Moist Heat;Traction;Ultrasound;DME Instruction;Gait training;Functional Biochemist, clinical;Therapeutic activities;Balance training;Neuromuscular re-education;Patient/family education;Therapeutic exercise;Manual techniques;Scar mobilization;Passive range of motion;Dry needling;Energy conservation;Taping    PT Next Visit Plan  review goals and HEP; focus initially on edema and ROM prior to progressing to strengthening    PT Home Exercise Plan  eval: quad set, heel slide, supine HS stretch    Consulted and Agree with Plan of Care  Patient       Patient will benefit from skilled therapeutic intervention in order to improve the following deficits and impairments:  Decreased activity tolerance, Decreased balance, Decreased range of motion, Decreased scar mobility, Decreased strength, Difficulty walking, Hypomobility, Increased edema, Increased fascial restricitons,  Increased muscle spasms, Impaired flexibility, Improper body mechanics, Pain  Visit Diagnosis: Acute pain of right knee - Plan: PT plan of care cert/re-cert  Stiffness of right knee, not elsewhere classified - Plan: PT plan of care cert/re-cert  Localized edema - Plan: PT plan of care cert/re-cert  Muscle weakness (generalized) - Plan: PT plan of care cert/re-cert     Problem List Patient Active Problem List   Diagnosis Date Noted  . Hypothyroidism 03/07/2013  . Morbid obesity ([place]) 03/07/2013       Geraldine Solar PT, [place] [address] [place], [place], [address] Phone: [phone]   Fax:  [phone]  Name: Kristen Drake MRN: [id number] Date of Birth: 1955/06/13

## 2017-10-16 ENCOUNTER — Encounter (HOSPITAL_COMMUNITY): Payer: Self-pay

## 2017-10-16 ENCOUNTER — Ambulatory Visit (HOSPITAL_COMMUNITY): Payer: 59

## 2017-10-16 DIAGNOSIS — M6281 Muscle weakness (generalized): Secondary | ICD-10-CM

## 2017-10-16 DIAGNOSIS — M25661 Stiffness of right knee, not elsewhere classified: Secondary | ICD-10-CM

## 2017-10-16 DIAGNOSIS — M25561 Pain in right knee: Secondary | ICD-10-CM

## 2017-10-16 DIAGNOSIS — R600 Localized edema: Secondary | ICD-10-CM

## 2017-10-16 NOTE — Therapy (Signed)
[place], [place], [address] Phone: [phone]   Fax:  [phone]  Physical Therapy Treatment  Patient Details  Name: Kristen Drake MRN: [id number] Date of Birth: [birth year] Referring Provider: Vickey Huger, MD   Encounter Date: 10/16/2017  PT End of Session - 10/16/17 1342    Visit Number  2    Number of Visits  13    Date for PT Re-Evaluation  11/10/17    Authorization Type  [employer]    Authorization Time Period  10/13/17 to 11/10/17    Authorization - Visit Number  3    Authorization - Number of Visits  60    PT Start Time  1856    PT Stop Time  1342    PT Time Calculation (min)  40 min    Activity Tolerance  Patient tolerated treatment well;No increased pain    Behavior During Therapy  [facility] for tasks assessed/performed       History reviewed. No pertinent past medical history.  History reviewed. No pertinent surgical history.  There were no vitals filed for this visit.  Subjective Assessment - 10/16/17 1303    Subjective  Pt states that she has some mild achiness in her knee but thinks it will get better once her swelling goes down. She is not using her SPC today.     Limitations  Walking;House hold activities    How long can you sit comfortably?  no issues    How long can you stand comfortably?  30 mins    How long can you walk comfortably?  30 mins    Patient Stated Goals  walk without pain    Currently in Pain?  Yes    Pain Score  2     Pain Location  Knee    Pain Orientation  Right    Pain Descriptors / Indicators  Aching    Pain Type  Surgical pain    Pain Onset  1 to 4 weeks ago    Pain Frequency  Constant    Aggravating Factors   WB     Pain Relieving Factors  rest    Effect of Pain on Daily Activities  slight increases            [facility] Adult PT Treatment/Exercise - 10/16/17 0001      Exercises   Exercises  Knee/Hip      Knee/Hip Exercises: Stretches   Passive  Hamstring Stretch  Right;2 reps;30 seconds    Passive Hamstring Stretch Limitations  supine with rope    Gastroc Stretch  Both;3 reps;30 seconds    Gastroc Stretch Limitations  standing on step      Knee/Hip Exercises: Standing   Gait Training  x2 laps around gym focusing on heel to toe gait pattern and decreasing R foot ER      Knee/Hip Exercises: Supine   Quad Sets  Right;15 reps    Quad Sets Limitations  5" holds, multimodal cues for proper quad set    Short Arc [employer]  Right;15 reps    Short Arc Quad Sets Limitations  3-5" holds    Heel Slides  Right;15 reps    Heel Slides Limitations  5" holds    Bridges  Both;15 reps    Straight Leg Raises  Right;15 reps    Straight Leg Raises Limitations  quad set prior    Knee Extension Limitations  6    Knee Flexion Limitations  100      Manual Therapy   Manual Therapy  Edema management    Manual therapy comments  separate rest of treatment    Edema Management  retro massage with BLE elevated to decrease swelling            PT Short Term Goals - 10/13/17 1619      PT SHORT TERM GOAL #1   Title  Pt will have improved R knee AROM from 5-105deg in order to decrease pain and maximize gait.     Time  2    Period  Weeks    Status  New    Target Date  10/27/17      PT SHORT TERM GOAL #2   Title  Pt will have decreased joint line edema by 1cm or > in order to maximize ROM and decrease pain.    Time  2    Period  Weeks    Status  New      PT SHORT TERM GOAL #3   Title  Pt will have improved 5xSTS to 10sec or < with proper form and without UE to demo improved functional hip strength and maximize her transfers.2    Time  2    Period  Weeks    Status  New        PT Long Term Goals - 10/13/17 1619      PT LONG TERM GOAL #1   Title  Pt will have improved R knee AROM from 0-115deg in order to further maximize gait and stair ambulation.    Time  4    Period  Weeks    Status  New    Target Date  11/10/17      PT LONG TERM  GOAL #2   Title  Pt will have improved MMT by 1 grade throughout in order to maximize functional mobility and return to PLOF    Time  4    Period  Weeks    Status  New      PT LONG TERM GOAL #3   Title  Pt will be able to ambulate at least 629ft during 3MWT without AD and gait WFL in order to maximize her return to community ambulation.    Time  4    Period  Weeks    Status  New      PT LONG TERM GOAL #4   Title  Pt will be able to perform bil SLS for 60 sec or > in order to demo improved core and hip strength as well as balance in order to maximize her stair ambulation and gait on uneven ground.     Time  4    Period  Weeks    Status  New            Plan - 10/16/17 1342    Clinical Impression Statement  Began by reviewing initial goals and HEP; issued copy of eval with no f/u questions. Mod cues required for proper quad set today as she tended to compensate with gluteal activation. Rest of session focused on R knee mobility and addressing edema. AROM 6 to 100deg this date. Continue as planned, progressing as able.    Rehab Potential  Excellent    PT Frequency  3x / week    PT Duration  4 weeks    PT Treatment/Interventions  ADLs/Self Care Home Management;Aquatic Therapy;Cryotherapy;Electrical Stimulation;Moist Heat;Traction;Ultrasound;DME Instruction;Gait training;Functional Biochemist, clinical;Therapeutic activities;Balance training;Neuromuscular re-education;Patient/family education;Therapeutic exercise;Manual techniques;Scar mobilization;Passive range  of motion;Dry needling;Energy conservation;Taping    PT Next Visit Plan  continue focus initially on edema and ROM prior to progressing to strengthening    PT Home Exercise Plan  eval: quad set, heel slide, supine HS stretch    Consulted and Agree with Plan of Care  Patient       Patient will benefit from skilled therapeutic intervention in order to improve the following deficits and impairments:  Decreased activity  tolerance, Decreased balance, Decreased range of motion, Decreased scar mobility, Decreased strength, Difficulty walking, Hypomobility, Increased edema, Increased fascial restricitons, Increased muscle spasms, Impaired flexibility, Improper body mechanics, Pain  Visit Diagnosis: Acute pain of right knee  Stiffness of right knee, not elsewhere classified  Localized edema  Muscle weakness (generalized)     Problem List Patient Active Problem List   Diagnosis Date Noted  . Hypothyroidism 03/07/2013  . Morbid obesity ([place]) 03/07/2013        Geraldine Solar PT, [place] [address] [place], [place], [address] Phone: [phone]   Fax:  [phone]  Name: Kristen Drake MRN: [id number] Date of Birth: 1955-07-28

## 2017-10-18 ENCOUNTER — Ambulatory Visit (HOSPITAL_COMMUNITY): Payer: 59

## 2017-10-18 ENCOUNTER — Encounter (HOSPITAL_COMMUNITY): Payer: Self-pay

## 2017-10-18 DIAGNOSIS — M25661 Stiffness of right knee, not elsewhere classified: Secondary | ICD-10-CM

## 2017-10-18 DIAGNOSIS — M25561 Pain in right knee: Secondary | ICD-10-CM

## 2017-10-18 DIAGNOSIS — M6281 Muscle weakness (generalized): Secondary | ICD-10-CM

## 2017-10-18 DIAGNOSIS — R600 Localized edema: Secondary | ICD-10-CM

## 2017-10-18 NOTE — Therapy (Signed)
[place] [address] [place], [place], [address] Phone: [phone]   Fax:  [phone]  Physical Therapy Treatment  Patient Details  Drake: Kristen Drake MRN: [id number] Date of Birth: Mar 11, 1955 Referring Provider: Vickey Huger, MD   Encounter Date: 10/18/2017  PT End of Session - 10/18/17 1300    Visit Number  3    Number of Visits  13    Date for PT Re-Evaluation  11/10/17    Authorization Type  [employer]    Authorization Time Period  10/13/17 to 11/10/17    Authorization - Visit Number  4    Authorization - Number of Visits  60    PT Start Time  1300    PT Stop Time  1341    PT Time Calculation (min)  41 min    Activity Tolerance  Patient tolerated treatment well;No increased pain    Behavior During Therapy  [facility] for tasks assessed/performed       History reviewed. No pertinent past medical history.  History reviewed. No pertinent surgical history.  There were no vitals filed for this visit.  Subjective Assessment - 10/18/17 1300    Subjective  Pt states that her knee kept her up last night with aching pain. She's still compliant with her HEP.     Limitations  Walking;House hold activities    How long can you sit comfortably?  no issues    How long can you stand comfortably?  30 mins    How long can you walk comfortably?  30 mins    Patient Stated Goals  walk without pain    Currently in Pain?  Yes    Pain Score  2     Pain Location  Knee    Pain Orientation  Right    Pain Descriptors / Indicators  Aching    Pain Type  Surgical pain    Pain Onset  1 to 4 weeks ago    Pain Frequency  Constant    Aggravating Factors   WB    Pain Relieving Factors  rest    Effect of Pain on Daily Activities  slight increases            [facility] Adult PT Treatment/Exercise - 10/18/17 0001      Exercises   Exercises  Knee/Hip      Knee/Hip Exercises: Stretches   Passive Hamstring Stretch  Right;3 reps;30 seconds    Passive Hamstring Stretch Limitations  standing, 12" step    Knee: Self-Stretch to increase Flexion  Right    Knee: Self-Stretch Limitations  10x10" on 12"step    Gastroc Stretch  Both;3 reps;30 seconds    Gastroc Stretch Limitations  slant board      Knee/Hip Exercises: Standing   Heel Raises  Both;20 reps    Heel Raises Limitations  heel and toe    Knee Flexion  Right;15 reps    Terminal Knee Extension  Right;10 reps    Terminal Knee Extension Limitations  10" holds, manual resistance    Rocker Board  2 minutes    Rocker Board Limitations  R/L       Knee/Hip Exercises: Supine   Quad Sets  Right;15 reps    Quad Sets Limitations  5" holds, multimodal cues for proper technique    Short Arc [employer]  Right;15 reps    Short Arc [employer] Limitations  3-5" holds    Bridges  Both;15 reps  Straight Leg Raises  Right;15 reps    Straight Leg Raises Limitations  quad set prior    Knee Extension Limitations  4    Knee Flexion Limitations  107      Manual Therapy   Manual Therapy  Edema management    Manual therapy comments  separate rest of treatment    Edema Management  retro massage with BLE elevated to decrease swelling            PT Education - 10/18/17 1300    Education Details  exercise technique, continue HEP    Person(s) Educated  Patient    Methods  Explanation;Demonstration    Comprehension  Verbalized understanding;Returned demonstration       PT Short Term Goals - 10/13/17 1619      PT SHORT TERM GOAL #1   Title  Pt will have improved R knee AROM from 5-105deg in order to decrease pain and maximize gait.     Time  2    Period  Weeks    Status  New    Target Date  10/27/17      PT SHORT TERM GOAL #2   Title  Pt will have decreased joint line edema by 1cm or > in order to maximize ROM and decrease pain.    Time  2    Period  Weeks    Status  New      PT SHORT TERM GOAL #3   Title  Pt will have improved 5xSTS to 10sec or < with proper form and without  UE to demo improved functional hip strength and maximize her transfers.2    Time  2    Period  Weeks    Status  New        PT Long Term Goals - 10/13/17 1619      PT LONG TERM GOAL #1   Title  Pt will have improved R knee AROM from 0-115deg in order to further maximize gait and stair ambulation.    Time  4    Period  Weeks    Status  New    Target Date  11/10/17      PT LONG TERM GOAL #2   Title  Pt will have improved MMT by 1 grade throughout in order to maximize functional mobility and return to PLOF    Time  4    Period  Weeks    Status  New      PT LONG TERM GOAL #3   Title  Pt will be able to ambulate at least 667ft during 3MWT without AD and gait WFL in order to maximize her return to community ambulation.    Time  4    Period  Weeks    Status  New      PT LONG TERM GOAL #4   Title  Pt will be able to perform bil SLS for 60 sec or > in order to demo improved core and hip strength as well as balance in order to maximize her stair ambulation and gait on uneven ground.     Time  4    Period  Weeks    Status  New            Plan - 10/18/17 1343    Clinical Impression Statement  Initiated standing stretching and standing mobility work this date. Pt mainly c/o discomfort at anterior lateral knee during exercises. Ended with supine mobility work and retro massage for edema control.  AROM progressing nicely as she was 4 to107deg this date. Updated HEP to include SAQ and SLR. Continue as planned, progressing as able.     Rehab Potential  Excellent    PT Frequency  3x / week    PT Duration  4 weeks    PT Treatment/Interventions  ADLs/Self Care Home Management;Aquatic Therapy;Cryotherapy;Electrical Stimulation;Moist Heat;Traction;Ultrasound;DME Instruction;Gait training;Functional Biochemist, clinical;Therapeutic activities;Balance training;Neuromuscular re-education;Patient/family education;Therapeutic exercise;Manual techniques;Scar mobilization;Passive range of  motion;Dry needling;Energy conservation;Taping    PT Next Visit Plan  continue focus initially on edema and ROM prior to progressing to strengthening    PT Home Exercise Plan  eval: quad set, heel slide, supine HS stretch; 9/25: SAQ, SLR    Consulted and Agree with Plan of Care  Patient       Patient will benefit from skilled therapeutic intervention in order to improve the following deficits and impairments:  Decreased activity tolerance, Decreased balance, Decreased range of motion, Decreased scar mobility, Decreased strength, Difficulty walking, Hypomobility, Increased edema, Increased fascial restricitons, Increased muscle spasms, Impaired flexibility, Improper body mechanics, Pain  Visit Diagnosis: Acute pain of right knee  Stiffness of right knee, not elsewhere classified  Localized edema  Muscle weakness (generalized)     Problem List Patient Active Problem List   Diagnosis Date Noted  . Hypothyroidism 03/07/2013  . Morbid obesity ([place]) 03/07/2013        Geraldine Solar PT, [place] [address] [place], [place], [address] Phone: [phone]   Fax:  [phone]  Drake: Kristen Drake MRN: [id number] Date of Birth: December 30, 1955

## 2017-10-19 ENCOUNTER — Other Ambulatory Visit: Payer: Self-pay

## 2017-10-19 ENCOUNTER — Ambulatory Visit (HOSPITAL_COMMUNITY): Payer: 59

## 2017-10-19 ENCOUNTER — Encounter (HOSPITAL_COMMUNITY): Payer: Self-pay

## 2017-10-19 DIAGNOSIS — M25561 Pain in right knee: Secondary | ICD-10-CM

## 2017-10-19 DIAGNOSIS — R600 Localized edema: Secondary | ICD-10-CM

## 2017-10-19 DIAGNOSIS — M25661 Stiffness of right knee, not elsewhere classified: Secondary | ICD-10-CM

## 2017-10-19 DIAGNOSIS — M6281 Muscle weakness (generalized): Secondary | ICD-10-CM

## 2017-10-19 NOTE — Therapy (Signed)
[place], [place], [address] Phone: [phone]   Fax:  [phone]  Physical Therapy Treatment  Patient Details  Name: Kristen Drake MRN: [id number] Date of Birth: 1955-05-31 Referring Provider: Vickey Huger, MD   Encounter Date: 10/19/2017  PT End of Session - 10/19/17 0952    Visit Number  4    Number of Visits  13    Date for PT Re-Evaluation  11/10/17    Authorization Type  [employer]    Authorization Time Period  10/13/17 to 11/10/17    Authorization - Visit Number  5    Authorization - Number of Visits  60    PT Start Time  5038    PT Stop Time  8828    PT Time Calculation (min)  40 min    Activity Tolerance  Patient tolerated treatment well;No increased pain    Behavior During Therapy  [facility] for tasks assessed/performed       History reviewed. No pertinent past medical history.  History reviewed. No pertinent surgical history.  There were no vitals filed for this visit.  Subjective Assessment - 10/19/17 0950    Subjective  Patient reports she is sore today and that she iced this morning for pain. She states the worst exercise is her heel slides. She states she is not really elevating right now and denies difficulty with new HEP.    Limitations  Walking;House hold activities    How long can you sit comfortably?  no issues    How long can you stand comfortably?  30 mins    How long can you walk comfortably?  30 mins    Patient Stated Goals  walk without pain    Currently in Pain?  Yes    Pain Score  4     Pain Location  Knee    Pain Orientation  Right    Pain Descriptors / Indicators  Aching    Pain Type  Surgical pain    Pain Onset  1 to 4 weeks ago    Pain Frequency  Constant    Aggravating Factors   walking, standing    Pain Relieving Factors  rest    Effect of Pain on Daily Activities  slight increased difficulty with walking       [facility] Adult PT Treatment/Exercise - 10/19/17 0001       Exercises   Exercises  Knee/Hip      Knee/Hip Exercises: Stretches   Passive Hamstring Stretch  Right;3 reps;30 seconds    Passive Hamstring Stretch Limitations  12" box    Knee: Self-Stretch to increase Flexion  Right;3 reps;30 seconds    Knee: Self-Stretch Limitations  12" box    Gastroc Stretch  Both;3 reps;30 seconds    Gastroc Stretch Limitations  slant board      Knee/Hip Exercises: Standing   Heel Raises  Both;20 reps;3 seconds   on incline   Heel Raises Limitations  toe raises, 1x 20 reps, 3 second holds, on decline    Rocker Board  2 minutes    Rocker Board Limitations  2x 1 minute, lateral for AROM      Knee/Hip Exercises: Supine   Short Arc Quad Sets  Right;1 set;15 reps    Short Arc Quad Sets Limitations  3 sec holds    Terminal Knee Extension  Right;1 set;15 reps    Terminal Knee Extension Limitations  pink ball behind knee, 3 sec  Bridges  Both;1 set;15 reps    Straight Leg Raises  Right;1 set;15 reps    Straight Leg Raises Limitations  quad set prior    Knee Extension Limitations  4    Knee Flexion Limitations  110      Knee/Hip Exercises: Sidelying   Clams  1x 15 reps, Rt LE      Manual Therapy   Manual Therapy  Edema management    Manual therapy comments  separate rest of treatment    Edema Management  retro massage with BLE elevated to decrease swelling        PT Education - 10/19/17 0952    Education Details  Educated on exercise throughout. Educate don ice and elevation techniques to reduce edema and facilitate knee extension.    Person(s) Educated  Patient    Methods  Explanation    Comprehension  Verbalized understanding       PT Short Term Goals - 10/13/17 1619      PT SHORT TERM GOAL #1   Title  Pt will have improved R knee AROM from 5-105deg in order to decrease pain and maximize gait.     Time  2    Period  Weeks    Status  New    Target Date  10/27/17      PT SHORT TERM GOAL #2   Title  Pt will have decreased joint line  edema by 1cm or > in order to maximize ROM and decrease pain.    Time  2    Period  Weeks    Status  New      PT SHORT TERM GOAL #3   Title  Pt will have improved 5xSTS to 10sec or < with proper form and without UE to demo improved functional hip strength and maximize her transfers.2    Time  2    Period  Weeks    Status  New        PT Long Term Goals - 10/13/17 1619      PT LONG TERM GOAL #1   Title  Pt will have improved R knee AROM from 0-115deg in order to further maximize gait and stair ambulation.    Time  4    Period  Weeks    Status  New    Target Date  11/10/17      PT LONG TERM GOAL #2   Title  Pt will have improved MMT by 1 grade throughout in order to maximize functional mobility and return to PLOF    Time  4    Period  Weeks    Status  New      PT LONG TERM GOAL #3   Title  Pt will be able to ambulate at least 628ft during 3MWT without AD and gait WFL in order to maximize her return to community ambulation.    Time  4    Period  Weeks    Status  New      PT LONG TERM GOAL #4   Title  Pt will be able to perform bil SLS for 60 sec or > in order to demo improved core and hip strength as well as balance in order to maximize her stair ambulation and gait on uneven ground.     Time  4    Period  Weeks    Status  New        Plan - 10/19/17 0953    Clinical Impression Statement  Therapy continued with established POC today with focus on ROM exercises and edema management. patient tolerated increased repetition with no difficulty or increase in pain. She initiated glut med strengthening with clamshell and required minimal cuing for proper form. Next session she will benefit from addition of band for strengthening. Educated patient on elevation and ice techniques to facilitate knee extension and reduce edema. Also instructed patient to not put any more steri-strips on her incision so we can begin scar mobilization. She will continue to benefit from skilled PT  interventions to address impairmetns and progress functional mobility.    Rehab Potential  Excellent    PT Frequency  3x / week    PT Duration  4 weeks    PT Treatment/Interventions  ADLs/Self Care Home Management;Aquatic Therapy;Cryotherapy;Electrical Stimulation;Moist Heat;Traction;Ultrasound;DME Instruction;Gait training;Functional Biochemist, clinical;Therapeutic activities;Balance training;Neuromuscular re-education;Patient/family education;Therapeutic exercise;Manual techniques;Scar mobilization;Passive range of motion;Dry needling;Energy conservation;Taping    PT Next Visit Plan  Continue focus edema and ROM. Update HEP with clamshell and add stationary bike next session for ROM as patient expressed interest.     PT Home Exercise Plan  eval: quad set, heel slide, supine HS stretch; 9/25: SAQ, SLR    Consulted and Agree with Plan of Care  Patient       Patient will benefit from skilled therapeutic intervention in order to improve the following deficits and impairments:  Decreased activity tolerance, Decreased balance, Decreased range of motion, Decreased scar mobility, Decreased strength, Difficulty walking, Hypomobility, Increased edema, Increased fascial restricitons, Increased muscle spasms, Impaired flexibility, Improper body mechanics, Pain  Visit Diagnosis: Acute pain of right knee  Stiffness of right knee, not elsewhere classified  Localized edema  Muscle weakness (generalized)     Problem List Patient Active Problem List   Diagnosis Date Noted  . Hypothyroidism 03/07/2013  . Morbid obesity ([place]) 03/07/2013    Kipp Brood, PT, DPT Physical Therapist with [facility]  10/19/2017 10:37 AM    [place], [place], [address] Phone: [phone]   Fax:  [phone]  Name: AMYIA LODWICK MRN: [id number] Date of Birth: July 26, 1955

## 2017-10-23 ENCOUNTER — Encounter (HOSPITAL_COMMUNITY): Payer: Self-pay

## 2017-10-23 ENCOUNTER — Ambulatory Visit (HOSPITAL_COMMUNITY): Payer: 59

## 2017-10-23 DIAGNOSIS — M25561 Pain in right knee: Secondary | ICD-10-CM

## 2017-10-23 DIAGNOSIS — M25661 Stiffness of right knee, not elsewhere classified: Secondary | ICD-10-CM

## 2017-10-23 DIAGNOSIS — R600 Localized edema: Secondary | ICD-10-CM

## 2017-10-23 DIAGNOSIS — M6281 Muscle weakness (generalized): Secondary | ICD-10-CM

## 2017-10-23 NOTE — Therapy (Signed)
[place], [place], [address] Phone: [phone]   Fax:  [phone]  Physical Therapy Treatment  Patient Details  Name: Kristen Drake MRN: [id number] Date of Birth: 1955-12-16 Referring Provider (PT): Vickey Huger, MD   Encounter Date: 10/23/2017  PT End of Session - 10/23/17 1258    Visit Number  5    Number of Visits  13    Date for PT Re-Evaluation  11/10/17    Authorization Type  [employer]    Authorization Time Period  10/13/17 to 11/10/17    Authorization - Visit Number  6    Authorization - Number of Visits  60    PT Start Time  1300    PT Stop Time  1342    PT Time Calculation (min)  42 min    Activity Tolerance  Patient tolerated treatment well;No increased pain    Behavior During Therapy  [facility] for tasks assessed/performed       History reviewed. No pertinent past medical history.  History reviewed. No pertinent surgical history.  There were no vitals filed for this visit.  Subjective Assessment - 10/23/17 1259    Subjective  Pt states that she is feeling pretty good. The clams caused her some lateral thigh pain afterwards but she's fine now.    Limitations  Walking;House hold activities    How long can you sit comfortably?  no issues    How long can you stand comfortably?  30 mins    How long can you walk comfortably?  30 mins    Patient Stated Goals  walk without pain    Currently in Pain?  No/denies    Pain Onset  1 to 4 weeks ago              [facility] Adult PT Treatment/Exercise - 10/23/17 0001      Exercises   Exercises  Knee/Hip      Knee/Hip Exercises: Stretches   Passive Hamstring Stretch  Right;3 reps;30 seconds    Passive Hamstring Stretch Limitations  12" box    Knee: Self-Stretch to increase Flexion  Right;3 reps;30 seconds    Knee: Self-Stretch Limitations  12" box    Gastroc Stretch  Both;3 reps;30 seconds    Gastroc Stretch Limitations  slant board      Knee/Hip Exercises: Aerobic   Stationary Bike  x4 mins, seate 12, fwd revolutions for mobility      Knee/Hip Exercises: Standing   Heel Raises  Both;20 reps;3 seconds    Heel Raises Limitations  heel and toe on incline    Terminal Knee Extension  Right;10 reps    Terminal Knee Extension Limitations  10" holds, RTB    Lateral Step Up  Right;10 reps;Step Height: 4";Hand Hold: 2    Forward Step Up  Right;10 reps;Step Height: 4";Hand Hold: 2    Functional Squat  10 reps    Functional Squat Limitations  BUE support on // bars    SLS  RLE, x5 reps (max of 11")    Other Standing Knee Exercises  tandem stance firm 3x10" each      Knee/Hip Exercises: Seated   Sit to Sand  10 reps;without UE support   2" step under LLE to improve weight shift R     Knee/Hip Exercises: Supine   Knee Extension Limitations  2    Knee Flexion Limitations  116      Manual Therapy   Manual Therapy  Edema management;Joint mobilization;Soft tissue mobilization    Manual therapy comments  separate rest of treatment    Edema Management  retro massage with BLE elevated to decrease swelling    Joint Mobilization  patellar mobs all directions for mobility    Soft tissue mobilization  STM to distal quad and proximal scar to reduce restrictions and adhesions             PT Education - 10/23/17 1259    Education Details  exercise technique, continue HEP    Person(s) Educated  Patient    Methods  Explanation;Demonstration    Comprehension  Verbalized understanding;Returned demonstration       PT Short Term Goals - 10/13/17 1619      PT SHORT TERM GOAL #1   Title  Pt will have improved R knee AROM from 5-105deg in order to decrease pain and maximize gait.     Time  2    Period  Weeks    Status  New    Target Date  10/27/17      PT SHORT TERM GOAL #2   Title  Pt will have decreased joint line edema by 1cm or > in order to maximize ROM and decrease pain.    Time  2    Period  Weeks    Status  New       PT SHORT TERM GOAL #3   Title  Pt will have improved 5xSTS to 10sec or < with proper form and without UE to demo improved functional hip strength and maximize her transfers.2    Time  2    Period  Weeks    Status  New        PT Long Term Goals - 10/13/17 1619      PT LONG TERM GOAL #1   Title  Pt will have improved R knee AROM from 0-115deg in order to further maximize gait and stair ambulation.    Time  4    Period  Weeks    Status  New    Target Date  11/10/17      PT LONG TERM GOAL #2   Title  Pt will have improved MMT by 1 grade throughout in order to maximize functional mobility and return to PLOF    Time  4    Period  Weeks    Status  New      PT LONG TERM GOAL #3   Title  Pt will be able to ambulate at least 610ft during 3MWT without AD and gait WFL in order to maximize her return to community ambulation.    Time  4    Period  Weeks    Status  New      PT LONG TERM GOAL #4   Title  Pt will be able to perform bil SLS for 60 sec or > in order to demo improved core and hip strength as well as balance in order to maximize her stair ambulation and gait on uneven ground.     Time  4    Period  Weeks    Status  New            Plan - 10/23/17 1343    Clinical Impression Statement  continued with established POC focusing on mobility and initiated strengthening this date with step ups, lunging, and mini squats. Pt tolerated all strengthening well, not reporting any increased pain during the exercises, just required min cues for proper exercise technique.  Ended session with patellar mobs, edema control, and STM to distal quad and proximal scar. AROM 2-116deg this date. Can begin to steadily increase strengthening as her AROM is normalizing.    Rehab Potential  Excellent    PT Frequency  3x / week    PT Duration  4 weeks    PT Treatment/Interventions  ADLs/Self Care Home Management;Aquatic Therapy;Cryotherapy;Electrical Stimulation;Moist Heat;Traction;Ultrasound;DME  Instruction;Gait training;Functional Biochemist, clinical;Therapeutic activities;Balance training;Neuromuscular re-education;Patient/family education;Therapeutic exercise;Manual techniques;Scar mobilization;Passive range of motion;Dry needling;Energy conservation;Taping    PT Next Visit Plan  Continue focus edema and ROM. Update HEP with clamshell; progress strengthening as tolerated    PT Home Exercise Plan  eval: quad set, heel slide, supine HS stretch; 9/25: SAQ, SLR    Consulted and Agree with Plan of Care  Patient       Patient will benefit from skilled therapeutic intervention in order to improve the following deficits and impairments:  Decreased activity tolerance, Decreased balance, Decreased range of motion, Decreased scar mobility, Decreased strength, Difficulty walking, Hypomobility, Increased edema, Increased fascial restricitons, Increased muscle spasms, Impaired flexibility, Improper body mechanics, Pain  Visit Diagnosis: Acute pain of right knee  Stiffness of right knee, not elsewhere classified  Localized edema  Muscle weakness (generalized)     Problem List Patient Active Problem List   Diagnosis Date Noted  . Hypothyroidism 03/07/2013  . Morbid obesity ([place]) 03/07/2013      Geraldine Solar PT, [place] [address] [place], [place], [address] Phone: [phone]   Fax:  [phone]  Name: Kristen Drake MRN: [id number] Date of Birth: [birth year]

## 2017-10-25 ENCOUNTER — Ambulatory Visit (HOSPITAL_COMMUNITY): Payer: 59 | Attending: Orthopedic Surgery

## 2017-10-25 ENCOUNTER — Encounter (HOSPITAL_COMMUNITY): Payer: Self-pay

## 2017-10-25 DIAGNOSIS — M25661 Stiffness of right knee, not elsewhere classified: Secondary | ICD-10-CM | POA: Diagnosis present

## 2017-10-25 DIAGNOSIS — M25561 Pain in right knee: Secondary | ICD-10-CM | POA: Insufficient documentation

## 2017-10-25 DIAGNOSIS — R600 Localized edema: Secondary | ICD-10-CM

## 2017-10-25 DIAGNOSIS — M6281 Muscle weakness (generalized): Secondary | ICD-10-CM | POA: Diagnosis present

## 2017-10-25 NOTE — Therapy (Signed)
[place], [place], [address] Phone: [phone]   Fax:  [phone]  Physical Therapy Drake  Patient Details  Name: Kristen Drake MRN: [id number] Date of Birth: 09/24/1955 Referring Provider (PT): Kristen Huger, MD   Encounter Date: 10/25/2017  PT End of Session - 10/25/17 1348    Visit Number  6    Number of Visits  13    Date for PT Re-Evaluation  11/10/17    Authorization Type  [employer]    Authorization Time Period  10/13/17 to 11/10/17    Authorization - Visit Number  7    Authorization - Number of Visits  60    PT Start Time  1300    PT Stop Time  1346    PT Time Calculation (min)  46 min    Activity Tolerance  Patient tolerated Drake well;No increased pain    Behavior During Therapy  [facility] for tasks assessed/performed       History reviewed. No pertinent past medical history.  History reviewed. No pertinent surgical history.  There were no vitals filed for this visit.  Subjective Assessment - 10/25/17 1300    Subjective  Pt stated she did some yard work this morning and has already rode her bike today.  No reports of pain currently, did report some lateral hip discomfort following clam exercise.  Reports permission to begin driving    Patient Stated Goals  walk without pain    Currently in Pain?  No/denies                       Kristen Drake - 10/25/17 0001      Exercises   Exercises  Knee/Hip      Knee/Hip Exercises: Stretches   Passive Hamstring Stretch  Right;3 reps;30 seconds    Passive Hamstring Stretch Limitations  supine    Gastroc Stretch  Both;3 reps;30 seconds    Gastroc Stretch Limitations  slant board      Knee/Hip Exercises: Aerobic   Stationary Bike  done at home today      Knee/Hip Exercises: Standing   Heel Raises  Both;20 reps;3 seconds    Heel Raises Limitations  heel and toe on incline    Terminal Knee Extension  Right;15  reps    Theraband Level (Terminal Knee Extension)  Level 2 (Red)    Terminal Knee Extension Limitations  10" holds, RTB    Lateral Step Up  Right;15 reps;Hand Hold: 0;Step Height: 4"    Forward Step Up  Right;15 reps;Hand Hold: 0;Step Height: 6"    Functional Squat  10 reps    Functional Squat Limitations  BUE support on // bars    SLS  5x (max of 33")    SLS with Vectors  3x5" holds with intermittent HHA on Rt LE    Other Standing Knee Exercises  tandem stance 2x 30" on foam    Other Standing Knee Exercises  sidestep 1RT RTB      Knee/Hip Exercises: Seated   Sit to Sand  10 reps;without UE support   2in step under Lt LE to improve weight shift Rt     Knee/Hip Exercises: Supine   Knee Extension Limitations  2    Knee Flexion Limitations  118      Manual Therapy   Manual Therapy  Edema management;Joint mobilization;Soft tissue mobilization    Manual therapy comments  separate rest of Drake  Edema Management  retro massage with BLE elevated to decrease swelling    Joint Mobilization  patellar mobs all directions for mobility    Soft tissue mobilization  STM to distal quad and proximal scar to reduce restrictions and adhesions               PT Short Term Goals - 10/13/17 1619      PT SHORT TERM GOAL #1   Title  Pt will have improved R knee AROM from 5-105deg in order to decrease pain and maximize gait.     Time  2    Period  Weeks    Status  New    Target Date  10/27/17      PT SHORT TERM GOAL #2   Title  Pt will have decreased joint line edema by 1cm or > in order to maximize ROM and decrease pain.    Time  2    Period  Weeks    Status  New      PT SHORT TERM GOAL #3   Title  Pt will have improved 5xSTS to 10sec or < with proper form and without UE to demo improved functional hip strength and maximize her transfers.2    Time  2    Period  Weeks    Status  New        PT Long Term Goals - 10/13/17 1619      PT LONG TERM GOAL #1   Title  Pt will have  improved R knee AROM from 0-115deg in order to further maximize gait and stair ambulation.    Time  4    Period  Weeks    Status  New    Target Date  11/10/17      PT LONG TERM GOAL #2   Title  Pt will have improved MMT by 1 grade throughout in order to maximize functional mobility and return to PLOF    Time  4    Period  Weeks    Status  New      PT LONG TERM GOAL #3   Title  Pt will be able to ambulate at least 682ft during 3MWT without AD and gait WFL in order to maximize her return to community ambulation.    Time  4    Period  Weeks    Status  New      PT LONG TERM GOAL #4   Title  Pt will be able to perform bil SLS for 60 sec or > in order to demo improved core and hip strength as well as balance in order to maximize her stair ambulation and gait on uneven ground.     Time  4    Period  Weeks    Status  New            Plan - 10/25/17 1424    Clinical Impression Statement  Continued with established POC focusing on knee mobility and strengthening.  Pt continues to exhibit gluteal and core weakness noted by leaning and hip instability with SLS.  Added sidestepping and vector stance to address impairment.  Pt is progressing well with mobility and functional strengthening.  Able to increase height wiht step up training today and AROM 2-118 degrees (was 2-116 degrees last session).  Continued with manual retrograde massage to address edema at EOS.  Did not add clam to HEP as pt expressed pain lateral hip wiht exercise.     Rehab Potential  Excellent  PT Frequency  3x / week    PT Duration  4 weeks    PT Drake/Interventions  ADLs/Self Care Home Management;Aquatic Therapy;Cryotherapy;Electrical Stimulation;Moist Heat;Traction;Ultrasound;DME Instruction;Gait training;Functional Biochemist, clinical;Therapeutic activities;Balance training;Neuromuscular re-education;Patient/family education;Therapeutic exercise;Manual techniques;Scar mobilization;Passive range of  motion;Dry needling;Energy conservation;Taping    PT Next Visit Plan  Continue focus edema and ROM.  Progress strengthening as tolerated    PT Home Exercise Plan  eval: quad set, heel slide, supine HS stretch; 9/25: SAQ, SLR       Patient will benefit from skilled therapeutic intervention in order to improve the following deficits and impairments:  Decreased activity tolerance, Decreased balance, Decreased range of motion, Decreased scar mobility, Decreased strength, Difficulty walking, Hypomobility, Increased edema, Increased fascial restricitons, Increased muscle spasms, Impaired flexibility, Improper body mechanics, Pain  Visit Diagnosis: Acute pain of right knee  Stiffness of right knee, not elsewhere classified  Localized edema  Muscle weakness (generalized)     Problem List Patient Active Problem List   Diagnosis Date Noted  . Hypothyroidism 03/07/2013  . Morbid obesity ([place]) 03/07/2013   Ihor Austin, [place]; [place]  Aldona Lento 10/25/2017, 2:30 PM  [place] [address] [place], [place], [address] Phone: [phone]   Fax:  [phone]  Name: Kristen Drake MRN: [id number] Date of Birth: 28-Dec-1955

## 2017-10-26 ENCOUNTER — Encounter (HOSPITAL_COMMUNITY): Payer: Self-pay | Admitting: Physical Therapy

## 2017-10-26 ENCOUNTER — Ambulatory Visit (HOSPITAL_COMMUNITY): Payer: 59 | Admitting: Physical Therapy

## 2017-10-26 DIAGNOSIS — M25561 Pain in right knee: Secondary | ICD-10-CM

## 2017-10-26 DIAGNOSIS — R600 Localized edema: Secondary | ICD-10-CM

## 2017-10-26 DIAGNOSIS — M6281 Muscle weakness (generalized): Secondary | ICD-10-CM

## 2017-10-26 DIAGNOSIS — M25661 Stiffness of right knee, not elsewhere classified: Secondary | ICD-10-CM

## 2017-10-26 NOTE — Therapy (Signed)
[place], [place], [address] Phone: [phone]   Fax:  [phone]  Physical Therapy Treatment  Patient Details  Name: Kristen Drake MRN: [id number] Date of Birth: December 18, 1955 Referring Provider (PT): Vickey Huger, MD   Encounter Date: 10/26/2017  PT End of Session - 10/26/17 1248    Visit Number  7    Number of Visits  13    Date for PT Re-Evaluation  11/10/17    Authorization Type  [employer]    Authorization Time Period  10/13/17 to 11/10/17    Authorization - Visit Number  8    Authorization - Number of Visits  60    PT Start Time  5329    PT Stop Time  9242    PT Time Calculation (min)  43 min    Activity Tolerance  Patient tolerated treatment well;No increased pain    Behavior During Therapy  [facility] for tasks assessed/performed       History reviewed. No pertinent past medical history.  History reviewed. No pertinent surgical history.  There were no vitals filed for this visit.  Subjective Assessment - 10/26/17 1122    Subjective  Patient reported some soreness, but not really pain. Patient stated that the clamshells she did bothered the lateral aspect of her leg some.     Patient Stated Goals  walk without pain    Currently in Pain?  No/denies                       [facility] Adult PT Treatment/Exercise - 10/26/17 0001      Exercises   Exercises  Knee/Hip      Knee/Hip Exercises: Stretches   Passive Hamstring Stretch  Right;3 reps;30 seconds    Passive Hamstring Stretch Limitations  12 inch step     Gastroc Stretch  Both;3 reps;30 seconds    Gastroc Stretch Limitations  slant board      Knee/Hip Exercises: Aerobic   Stationary Bike  done at home today      Knee/Hip Exercises: Standing   Heel Raises  Both;20 reps;3 seconds    Heel Raises Limitations  heel and toe on incline    Terminal Knee Extension  Right;15 reps    Theraband Level (Terminal Knee Extension)  Level 2 (Red)     Terminal Knee Extension Limitations  10" holds, RTB    Lateral Step Up  Right;15 reps;Hand Hold: 0;Step Height: 4"    Forward Step Up  Right;15 reps;Hand Hold: 0;Step Height: 6";2 sets    Functional Squat  20 reps    Functional Squat Limitations  2x10; BUE support on // bars    SLS  5x on right (max of 26")    SLS with Vectors  3x5" holds with intermittent HHA on Rt LE 5 repetitions    Other Standing Knee Exercises  tandem stance 2x 30" on foam each leg forward    Other Standing Knee Exercises  sidestep 14 feet x1RT RTB      Knee/Hip Exercises: Seated   Sit to Sand  10 reps;without UE support   2-inch step under left lower extremity      Knee/Hip Exercises: Supine   Knee Extension Limitations  2    Knee Flexion Limitations  118      Manual Therapy   Manual Therapy  Edema management;Joint mobilization    Manual therapy comments  All manual therapy completed separate from all other  skilled interventions    Edema Management  retro massage with BLE elevated to decrease swelling    Joint Mobilization  patellar mobs all directions for mobility             PT Education - 10/26/17 1248    Education Details  Discussed purpose and technique of interventions throughout the session.     Person(s) Educated  Patient    Methods  Explanation;Demonstration    Comprehension  Verbalized understanding       PT Short Term Goals - 10/13/17 1619      PT SHORT TERM GOAL #1   Title  Pt will have improved R knee AROM from 5-105deg in order to decrease pain and maximize gait.     Time  2    Period  Weeks    Status  New    Target Date  10/27/17      PT SHORT TERM GOAL #2   Title  Pt will have decreased joint line edema by 1cm or > in order to maximize ROM and decrease pain.    Time  2    Period  Weeks    Status  New      PT SHORT TERM GOAL #3   Title  Pt will have improved 5xSTS to 10sec or < with proper form and without UE to demo improved functional hip strength and maximize her  transfers.2    Time  2    Period  Weeks    Status  New        PT Long Term Goals - 10/13/17 1619      PT LONG TERM GOAL #1   Title  Pt will have improved R knee AROM from 0-115deg in order to further maximize gait and stair ambulation.    Time  4    Period  Weeks    Status  New    Target Date  11/10/17      PT LONG TERM GOAL #2   Title  Pt will have improved MMT by 1 grade throughout in order to maximize functional mobility and return to PLOF    Time  4    Period  Weeks    Status  New      PT LONG TERM GOAL #3   Title  Pt will be able to ambulate at least 630ft during 3MWT without AD and gait WFL in order to maximize her return to community ambulation.    Time  4    Period  Weeks    Status  New      PT LONG TERM GOAL #4   Title  Pt will be able to perform bil SLS for 60 sec or > in order to demo improved core and hip strength as well as balance in order to maximize her stair ambulation and gait on uneven ground.     Time  4    Period  Weeks    Status  New            Plan - 10/26/17 1249    Clinical Impression Statement  This session continued with established plan of care. This session continued to focus on improving patient's ROM and strength. Performed manual therapy at the end of the session to decrease edema and improve patellofemoral mobility. Continued to note stiffness with patellar mobility in all directions. This session patient's AROM remained about the same from last session. Plan to continue with progression of mobility and strengthening activities as well  as manual therapy as needed.     Rehab Potential  Excellent    PT Frequency  3x / week    PT Duration  4 weeks    PT Treatment/Interventions  ADLs/Self Care Home Management;Aquatic Therapy;Cryotherapy;Electrical Stimulation;Moist Heat;Traction;Ultrasound;DME Instruction;Gait training;Functional Biochemist, clinical;Therapeutic activities;Balance training;Neuromuscular  re-education;Patient/family education;Therapeutic exercise;Manual techniques;Scar mobilization;Passive range of motion;Dry needling;Energy conservation;Taping    PT Next Visit Plan  Continue focus edema and ROM.  Progress strengthening as tolerated    PT Home Exercise Plan  eval: quad set, heel slide, supine HS stretch; 9/25: SAQ, SLR       Patient will benefit from skilled therapeutic intervention in order to improve the following deficits and impairments:  Decreased activity tolerance, Decreased balance, Decreased range of motion, Decreased scar mobility, Decreased strength, Difficulty walking, Hypomobility, Increased edema, Increased fascial restricitons, Increased muscle spasms, Impaired flexibility, Improper body mechanics, Pain  Visit Diagnosis: Acute pain of right knee  Stiffness of right knee, not elsewhere classified  Localized edema  Muscle weakness (generalized)     Problem List Patient Active Problem List   Diagnosis Date Noted  . Hypothyroidism 03/07/2013  . Morbid obesity ([place]) 03/07/2013   Clarene Critchley PT, DPT 12:54 PM, 10/26/17 [place] [address] [place], [place], [address] Phone: [phone]   Fax:  [phone]  Name: Kristen Drake MRN: [id number] Date of Birth: 1955/12/05

## 2017-10-30 ENCOUNTER — Ambulatory Visit (HOSPITAL_COMMUNITY): Payer: 59 | Admitting: Physical Therapy

## 2017-10-30 DIAGNOSIS — M6281 Muscle weakness (generalized): Secondary | ICD-10-CM

## 2017-10-30 DIAGNOSIS — R600 Localized edema: Secondary | ICD-10-CM

## 2017-10-30 DIAGNOSIS — M25561 Pain in right knee: Secondary | ICD-10-CM

## 2017-10-30 DIAGNOSIS — M25661 Stiffness of right knee, not elsewhere classified: Secondary | ICD-10-CM

## 2017-10-30 NOTE — Therapy (Signed)
[place], [place], [address] Phone: [phone]   Fax:  [phone]  Physical Therapy Treatment  Patient Details  Name: Kristen Drake MRN: [id number] Date of Birth: 10-29-1955 Referring Provider (PT): Vickey Huger, MD   Encounter Date: 10/30/2017  PT End of Session - 10/30/17 1604    Visit Number  8    Number of Visits  13    Date for PT Re-Evaluation  11/10/17    Authorization Type  [employer]    Authorization Time Period  10/13/17 to 11/10/17    Authorization - Visit Number  8    Authorization - Number of Visits  60    PT Start Time  1522    PT Stop Time  6734    PT Time Calculation (min)  46 min    Activity Tolerance  Patient tolerated treatment well;No increased pain    Behavior During Therapy  [facility] for tasks assessed/performed       No past medical history on file.  No past surgical history on file.  There were no vitals filed for this visit.  Subjective Assessment - 10/30/17 1526    Subjective  Pt states she went to the [place] [place] in [place] and did alot of walking/steps over the weekend and did well, however she was tired with some swelling.  Currently without pain in her Rt knee, sometimes has achiness.    Currently in Pain?  No/denies                       [facility] Adult PT Treatment/Exercise - 10/30/17 0001      Knee/Hip Exercises: Stretches   Passive Hamstring Stretch  Right;3 reps;30 seconds    Passive Hamstring Stretch Limitations  12 inch step     Knee: Self-Stretch to increase Flexion  Right;10 seconds;Limitations    Knee: Self-Stretch Limitations  10 reps on 12" box       Knee/Hip Exercises: Aerobic   Stationary Bike  done at home today      Knee/Hip Exercises: Standing   Lateral Step Up  Right;10 reps;Step Height: 4";Hand Hold: 0    Forward Step Up  Right;10 reps;Step Height: 4";Hand Hold: 1    Step Down  Right;10 reps;Step Height: 4";Hand Hold: 1    Functional Squat   20 reps    Functional Squat Limitations  2x10; BUE support on // bars    SLS with Vectors  10x5" holds with intermittent HHA on Rt LE 5 repetitions    Other Standing Knee Exercises  tandem stance on foam 10X each with UE flexion 2# bar 10 reps each    Other Standing Knee Exercises  sidestep 14 feet x1RT RTB      Knee/Hip Exercises: Supine   Knee Extension Limitations  0    Knee Flexion Limitations  120      Modalities   Modalities  Cryotherapy   not included in billing     Cryotherapy   Number Minutes Cryotherapy  10 Minutes    Cryotherapy Location  Knee    Type of Cryotherapy  Ice pack      Manual Therapy   Manual Therapy  Myofascial release    Manual therapy comments  All manual therapy completed separate from all other skilled interventions    Myofascial Release  to tight scar line/adhesions .               PT Short Term Goals -  10/13/17 1619      PT SHORT TERM GOAL #1   Title  Pt will have improved R knee AROM from 5-105deg in order to decrease pain and maximize gait.     Time  2    Period  Weeks    Status  New    Target Date  10/27/17      PT SHORT TERM GOAL #2   Title  Pt will have decreased joint line edema by 1cm or > in order to maximize ROM and decrease pain.    Time  2    Period  Weeks    Status  New      PT SHORT TERM GOAL #3   Title  Pt will have improved 5xSTS to 10sec or < with proper form and without UE to demo improved functional hip strength and maximize her transfers.2    Time  2    Period  Weeks    Status  New        PT Long Term Goals - 10/13/17 1619      PT LONG TERM GOAL #1   Title  Pt will have improved R knee AROM from 0-115deg in order to further maximize gait and stair ambulation.    Time  4    Period  Weeks    Status  New    Target Date  11/10/17      PT LONG TERM GOAL #2   Title  Pt will have improved MMT by 1 grade throughout in order to maximize functional mobility and return to PLOF    Time  4    Period  Weeks     Status  New      PT LONG TERM GOAL #3   Title  Pt will be able to ambulate at least 686ft during 3MWT without AD and gait WFL in order to maximize her return to community ambulation.    Time  4    Period  Weeks    Status  New      PT LONG TERM GOAL #4   Title  Pt will be able to perform bil SLS for 60 sec or > in order to demo improved core and hip strength as well as balance in order to maximize her stair ambulation and gait on uneven ground.     Time  4    Period  Weeks    Status  New            Plan - 10/30/17 1606    Clinical Impression Statement  continued with work on improving functional strength in [place].  Addded forward step downs with emphasis on eccentric control.  Increased lateral and forward step ups to 6" height with no pain or issues reported.  PT able to complete most exercises in good form today.  Added myofascial as patient with adhesions along distal scar line, medial>lateral. Improved ROM today 0-120 following manual.      Rehab Potential  Excellent    PT Frequency  3x / week    PT Duration  4 weeks    PT Treatment/Interventions  ADLs/Self Care Home Management;Aquatic Therapy;Cryotherapy;Electrical Stimulation;Moist Heat;Traction;Ultrasound;DME Instruction;Gait training;Functional Biochemist, clinical;Therapeutic activities;Balance training;Neuromuscular re-education;Patient/family education;Therapeutic exercise;Manual techniques;Scar mobilization;Passive range of motion;Dry needling;Energy conservation;Taping    PT Next Visit Plan  Continue manual as needed to decrease adhesions.   Progress strengthening as tolerated to return to normal function.      PT Home Exercise Plan  eval: quad set,  heel slide, supine HS stretch; 9/25: SAQ, SLR       Patient will benefit from skilled therapeutic intervention in order to improve the following deficits and impairments:  Decreased activity tolerance, Decreased balance, Decreased range of motion, Decreased scar  mobility, Decreased strength, Difficulty walking, Hypomobility, Increased edema, Increased fascial restricitons, Increased muscle spasms, Impaired flexibility, Improper body mechanics, Pain  Visit Diagnosis: Acute pain of right knee  Stiffness of right knee, not elsewhere classified  Localized edema  Muscle weakness (generalized)     Problem List Patient Active Problem List   Diagnosis Date Noted  . Hypothyroidism 03/07/2013  . Morbid obesity ([place]) 03/07/2013    Roseanne Reno B 10/30/2017, 4:10 PM  [place] [address] [place], [place], [address] Phone: [phone]   Fax:  [phone]  Name: Kristen Drake MRN: [id number] Date of Birth: 1955/07/25

## 2017-11-01 ENCOUNTER — Ambulatory Visit (HOSPITAL_COMMUNITY): Payer: 59

## 2017-11-01 ENCOUNTER — Telehealth (HOSPITAL_COMMUNITY): Payer: Self-pay | Admitting: Family Medicine

## 2017-11-01 NOTE — Telephone Encounter (Signed)
11/01/17  pt called to cx said that her husband was being taken to the ED in [place] and she didn't think she would be back in time

## 2017-11-03 ENCOUNTER — Encounter (HOSPITAL_COMMUNITY): Payer: Self-pay

## 2017-11-03 ENCOUNTER — Ambulatory Visit (HOSPITAL_COMMUNITY): Payer: 59

## 2017-11-03 DIAGNOSIS — M6281 Muscle weakness (generalized): Secondary | ICD-10-CM

## 2017-11-03 DIAGNOSIS — R600 Localized edema: Secondary | ICD-10-CM

## 2017-11-03 DIAGNOSIS — M25561 Pain in right knee: Secondary | ICD-10-CM

## 2017-11-03 DIAGNOSIS — M25661 Stiffness of right knee, not elsewhere classified: Secondary | ICD-10-CM

## 2017-11-03 NOTE — Therapy (Signed)
[place], [place], [address] Phone: [phone]   Fax:  [phone]  Physical Therapy Treatment  Patient Details  Name: Kristen Drake MRN: [id number] Date of Birth: 02-03-1955 Referring Provider (PT): Vickey Huger, MD   Encounter Date: 11/03/2017  PT End of Session - 11/03/17 1344    Visit Number  9    Number of Visits  13    Date for PT Re-Evaluation  11/10/17    Authorization Type  [employer]    Authorization Time Period  10/13/17 to 11/10/17    Authorization - Visit Number  9    Authorization - Number of Visits  60    PT Start Time  6962    PT Stop Time  9528    PT Time Calculation (min)  40 min    Activity Tolerance  Patient tolerated treatment well;No increased pain    Behavior During Therapy  [facility] for tasks assessed/performed       History reviewed. No pertinent past medical history.  History reviewed. No pertinent surgical history.  There were no vitals filed for this visit.  Subjective Assessment - 11/03/17 1345    Subjective  Pt states that she just noticed that her swelling has gone down. She is up to riding her bike for 30 mins every morning.    Currently in Pain?  No/denies             [facility] Adult PT Treatment/Exercise - 11/03/17 0001      Exercises   Exercises  Knee/Hip      Knee/Hip Exercises: Stretches   Passive Hamstring Stretch  Right;3 reps;30 seconds    Passive Hamstring Stretch Limitations  12 inch step     Quad Stretch  Right;3 reps;30 seconds    Quad Stretch Limitations  prone with rope    Knee: Self-Stretch to increase Flexion  Right;10 seconds;Limitations    Knee: Self-Stretch Limitations  10 reps on 12" box     Gastroc Stretch  Both;3 reps;30 seconds    Gastroc Stretch Limitations  slant board      Knee/Hip Exercises: Machines for Strengthening   Cybex Knee Extension  BLE, 10#, x20 reps    Cybex Knee Flexion  BLE, 4 plates, x20 reps    Total Gym Leg Press   multigym leg press: BLE, 50#, x10 reps      Knee/Hip Exercises: Standing   Lateral Step Up  Right;10 reps;Step Height: 6";Hand Hold: 0    Forward Step Up  Right;10 reps;Step Height: 6";Hand Hold: 0    Step Down  Right;10 reps;Step Height: 6";Hand Hold: 2    SLS  BLE foam, 5x10-15" holds, intermittent UE support    Other Standing Knee Exercises  tandem stance foam +palov press RTB x15 reps each    Other Standing Knee Exercises  fwd tandem gait 68ft x2RT      Knee/Hip Exercises: Supine   Knee Extension Limitations  0    Knee Flexion Limitations  120      Manual Therapy   Manual Therapy  Myofascial release    Manual therapy comments  All manual therapy completed separate from all other skilled interventions    Myofascial Release  to tight scar line/adhesions .             PT Education - 11/03/17 1345    Education Details  exercise technique, continue HEP    Person(s) Educated  Patient    Methods  Explanation;Demonstration  Comprehension  Verbalized understanding;Returned demonstration       PT Short Term Goals - 10/13/17 1619      PT SHORT TERM GOAL #1   Title  Pt will have improved R knee AROM from 5-105deg in order to decrease pain and maximize gait.     Time  2    Period  Weeks    Status  New    Target Date  10/27/17      PT SHORT TERM GOAL #2   Title  Pt will have decreased joint line edema by 1cm or > in order to maximize ROM and decrease pain.    Time  2    Period  Weeks    Status  New      PT SHORT TERM GOAL #3   Title  Pt will have improved 5xSTS to 10sec or < with proper form and without UE to demo improved functional hip strength and maximize her transfers.2    Time  2    Period  Weeks    Status  New        PT Long Term Goals - 10/13/17 1619      PT LONG TERM GOAL #1   Title  Pt will have improved R knee AROM from 0-115deg in order to further maximize gait and stair ambulation.    Time  4    Period  Weeks    Status  New    Target Date   11/10/17      PT LONG TERM GOAL #2   Title  Pt will have improved MMT by 1 grade throughout in order to maximize functional mobility and return to PLOF    Time  4    Period  Weeks    Status  New      PT LONG TERM GOAL #3   Title  Pt will be able to ambulate at least 651ft during 3MWT without AD and gait WFL in order to maximize her return to community ambulation.    Time  4    Period  Weeks    Status  New      PT LONG TERM GOAL #4   Title  Pt will be able to perform bil SLS for 60 sec or > in order to demo improved core and hip strength as well as balance in order to maximize her stair ambulation and gait on uneven ground.     Time  4    Period  Weeks    Status  New            Plan - 11/03/17 1426    Clinical Impression Statement  Overall pt is making great progress. Continued with established POC focusing on functional and BLE strengthening. Added wall squats and machine strengthening this date all with good tolerance, but pt was challenged with wall squats. Also added prone quad stretch to address c/o R quad tightness. Ended with MFR to address restrictions in VLO. AROM maintaining steady at 0-120deg.     Rehab Potential  Excellent    PT Frequency  3x / week    PT Duration  4 weeks    PT Treatment/Interventions  ADLs/Self Care Home Management;Aquatic Therapy;Cryotherapy;Electrical Stimulation;Moist Heat;Traction;Ultrasound;DME Instruction;Gait training;Functional Biochemist, clinical;Therapeutic activities;Balance training;Neuromuscular re-education;Patient/family education;Therapeutic exercise;Manual techniques;Scar mobilization;Passive range of motion;Dry needling;Energy conservation;Taping    PT Next Visit Plan  Continue manual as needed to decrease adhesions.   Progress strengthening as tolerated to return to normal function.  PT Home Exercise Plan  eval: quad set, heel slide, supine HS stretch; 9/25: SAQ, SLR; 10/11: prone quad stretch    Consulted and Agree  with Plan of Care  Patient       Patient will benefit from skilled therapeutic intervention in order to improve the following deficits and impairments:  Decreased activity tolerance, Decreased balance, Decreased range of motion, Decreased scar mobility, Decreased strength, Difficulty walking, Hypomobility, Increased edema, Increased fascial restricitons, Increased muscle spasms, Impaired flexibility, Improper body mechanics, Pain  Visit Diagnosis: Acute pain of right knee  Stiffness of right knee, not elsewhere classified  Localized edema  Muscle weakness (generalized)     Problem List Patient Active Problem List   Diagnosis Date Noted  . Hypothyroidism 03/07/2013  . Morbid obesity ([place]) 03/07/2013        Geraldine Solar PT, [place] [address] [place], [place], [address] Phone: [phone]   Fax:  [phone]  Name: Kristen Drake MRN: [id number] Date of Birth: 1955-05-26

## 2017-11-06 ENCOUNTER — Ambulatory Visit (HOSPITAL_COMMUNITY): Payer: 59

## 2017-11-06 ENCOUNTER — Encounter (HOSPITAL_COMMUNITY): Payer: Self-pay

## 2017-11-06 DIAGNOSIS — R600 Localized edema: Secondary | ICD-10-CM

## 2017-11-06 DIAGNOSIS — M25561 Pain in right knee: Secondary | ICD-10-CM

## 2017-11-06 DIAGNOSIS — M25661 Stiffness of right knee, not elsewhere classified: Secondary | ICD-10-CM

## 2017-11-06 DIAGNOSIS — M6281 Muscle weakness (generalized): Secondary | ICD-10-CM

## 2017-11-06 NOTE — Therapy (Signed)
[place] [address] [place], [place], [address] Phone: [phone]   Fax:  [phone]  Physical Therapy Treatment  Patient Details  Name: Kristen Drake MRN: [id number] Date of Birth: 03/24/1955 Referring Provider (PT): Vickey Huger, MD   Encounter Date: 11/06/2017  PT End of Session - 11/06/17 1349    Visit Number  10    Number of Visits  13    Date for PT Re-Evaluation  11/10/17    Authorization Type  [employer]    Authorization Time Period  10/13/17 to 11/10/17    Authorization - Visit Number  10    Authorization - Number of Visits  60    PT Start Time  2248    Activity Tolerance  Patient tolerated treatment well;No increased pain    Behavior During Therapy  [facility] for tasks assessed/performed       History reviewed. No pertinent past medical history.  History reviewed. No pertinent surgical history.  There were no vitals filed for this visit.  Subjective Assessment - 11/06/17 1348    Subjective  Pt reports that her knee is feeling great.     Currently in Pain?  No/denies             [facility] Adult PT Treatment/Exercise - 11/06/17 0001      Exercises   Exercises  Knee/Hip      Knee/Hip Exercises: Stretches   Passive Hamstring Stretch  Right;3 reps;30 seconds    Passive Hamstring Stretch Limitations  12 inch step     Knee: Self-Stretch to increase Flexion  Right;5 reps;10 seconds    Knee: Self-Stretch Limitations  12" box    Gastroc Stretch  Both;3 reps;30 seconds    Gastroc Stretch Limitations  slant board      Knee/Hip Exercises: Machines for Strengthening   Cybex Knee Extension  BLE, 20#, 2x10    Cybex Knee Flexion  BLE, 5 plates, x20 reps    Total Gym Leg Press  multigym leg press: BLE, 60#, x10 reps    Other Machine  fwd/retro resisted walking 50# x5RT each      Knee/Hip Exercises: Standing   Hip Abduction  Both;2 sets;10 reps    Abduction Limitations  diagonals, GTB    SLS  BLE foam, 5x10-15"  holds, intermittent UE support    SLS with Vectors  x3RT foam, BLE    Gait Training  sidestepping GTB 73ft x2RT    Other Standing Knee Exercises  tandem stance foam +palov press RTB x15 reps each foot forward and each side    Other Standing Knee Exercises  fwd/retro tandem gait foam x2RT each      Knee/Hip Exercises: Supine   Knee Extension Limitations  0    Knee Flexion Limitations  120            PT Education - 11/06/17 1349    Education Details  exercise technique, continue HEP    Person(s) Educated  Patient    Methods  Explanation;Demonstration    Comprehension  Verbalized understanding;Returned demonstration       PT Short Term Goals - 10/13/17 1619      PT SHORT TERM GOAL #1   Title  Pt will have improved R knee AROM from 5-105deg in order to decrease pain and maximize gait.     Time  2    Period  Weeks    Status  New    Target Date  10/27/17  PT SHORT TERM GOAL #2   Title  Pt will have decreased joint line edema by 1cm or > in order to maximize ROM and decrease pain.    Time  2    Period  Weeks    Status  New      PT SHORT TERM GOAL #3   Title  Pt will have improved 5xSTS to 10sec or < with proper form and without UE to demo improved functional hip strength and maximize her transfers.2    Time  2    Period  Weeks    Status  New        PT Long Term Goals - 10/13/17 1619      PT LONG TERM GOAL #1   Title  Pt will have improved R knee AROM from 0-115deg in order to further maximize gait and stair ambulation.    Time  4    Period  Weeks    Status  New    Target Date  11/10/17      PT LONG TERM GOAL #2   Title  Pt will have improved MMT by 1 grade throughout in order to maximize functional mobility and return to PLOF    Time  4    Period  Weeks    Status  New      PT LONG TERM GOAL #3   Title  Pt will be able to ambulate at least 664ft during 3MWT without AD and gait WFL in order to maximize her return to community ambulation.    Time  4     Period  Weeks    Status  New      PT LONG TERM GOAL #4   Title  Pt will be able to perform bil SLS for 60 sec or > in order to demo improved core and hip strength as well as balance in order to maximize her stair ambulation and gait on uneven ground.     Time  4    Period  Weeks    Status  New            Plan - 11/06/17 1426    Clinical Impression Statement  Continued with established POC focusing on functional strengthening and dynamic balance. Added resisted walking to POC and able to progress weight throughout which pt tolerated well. Pt continues to be challenged with balance work on foam. AROM 0-120deg this date. Continue as planned, progressing as able. Pt sees MD after next appointment so updated #s will be sent to him at that time.     Rehab Potential  Excellent    PT Frequency  3x / week    PT Duration  4 weeks    PT Treatment/Interventions  ADLs/Self Care Home Management;Aquatic Therapy;Cryotherapy;Electrical Stimulation;Moist Heat;Traction;Ultrasound;DME Instruction;Gait training;Functional Biochemist, clinical;Therapeutic activities;Balance training;Neuromuscular re-education;Patient/family education;Therapeutic exercise;Manual techniques;Scar mobilization;Passive range of motion;Dry needling;Energy conservation;Taping    PT Next Visit Plan  send MD updated #s, potentially d/c early?  Continue manual as needed to decrease adhesions.   Progress strengthening as tolerated to return to normal function.      PT Home Exercise Plan  eval: quad set, heel slide, supine HS stretch; 9/25: SAQ, SLR; 10/11: prone quad stretch; 10/14: sidestepping, hip diagonals, GTB    Consulted and Agree with Plan of Care  Patient       Patient will benefit from skilled therapeutic intervention in order to improve the following deficits and impairments:  Decreased activity tolerance, Decreased balance, Decreased range  of motion, Decreased scar mobility, Decreased strength, Difficulty walking,  Hypomobility, Increased edema, Increased fascial restricitons, Increased muscle spasms, Impaired flexibility, Improper body mechanics, Pain  Visit Diagnosis: Acute pain of right knee  Stiffness of right knee, not elsewhere classified  Localized edema  Muscle weakness (generalized)     Problem List Patient Active Problem List   Diagnosis Date Noted  . Hypothyroidism 03/07/2013  . Morbid obesity ([place]) 03/07/2013       Geraldine Solar PT, [place] [address] [place], [place], [address] Phone: [phone]   Fax:  [phone]  Name: Kristen Drake MRN: [id number] Date of Birth: 29-Sep-1955

## 2017-11-08 ENCOUNTER — Ambulatory Visit (HOSPITAL_COMMUNITY): Payer: 59 | Admitting: Physical Therapy

## 2017-11-08 ENCOUNTER — Encounter (HOSPITAL_COMMUNITY): Payer: Self-pay | Admitting: Physical Therapy

## 2017-11-08 DIAGNOSIS — M25661 Stiffness of right knee, not elsewhere classified: Secondary | ICD-10-CM

## 2017-11-08 DIAGNOSIS — M25561 Pain in right knee: Secondary | ICD-10-CM | POA: Diagnosis not present

## 2017-11-08 DIAGNOSIS — M6281 Muscle weakness (generalized): Secondary | ICD-10-CM

## 2017-11-08 DIAGNOSIS — R600 Localized edema: Secondary | ICD-10-CM

## 2017-11-08 NOTE — Therapy (Signed)
[place], [place], [address] Phone: [phone]   Fax:  [phone]  Physical Therapy Treatment  Patient Details  Name: Kristen Drake MRN: [id number] Date of Birth: 24-Jan-1956 Referring Provider (PT): Vickey Huger, MD   Encounter Date: 11/08/2017   Right Knee AROM 0-121 degrees.   PT End of Session - 11/08/17 0905    Visit Number  11    Number of Visits  13    Date for PT Re-Evaluation  11/10/17    Authorization Type  [employer]    Authorization Time Period  10/13/17 to 11/10/17    Authorization - Visit Number  11    Authorization - Number of Visits  60    PT Start Time  0901    PT Stop Time  0939    PT Time Calculation (min)  38 min    Activity Tolerance  Patient tolerated treatment well;No increased pain    Behavior During Therapy  [facility] for tasks assessed/performed       History reviewed. No pertinent past medical history.  History reviewed. No pertinent surgical history.  There were no vitals filed for this visit.  Subjective Assessment - 11/08/17 0904    Subjective  Patient reported that she doesn't have any pain.    Currently in Pain?  No/denies                       [facility] Adult PT Treatment/Exercise - 11/08/17 0001      Exercises   Exercises  Knee/Hip      Knee/Hip Exercises: Stretches   Passive Hamstring Stretch  Right;3 reps;30 seconds    Passive Hamstring Stretch Limitations  12 inch step     Knee: Self-Stretch to increase Flexion  Right;10 seconds;Other (comment)   10 reps   Knee: Self-Stretch Limitations  12" box    Gastroc Stretch  Both;3 reps;30 seconds    Gastroc Stretch Limitations  slant board      Knee/Hip Exercises: Machines for Strengthening   Total Gym Leg Press  multigym leg press: BLE, 60#, x10 reps    Other Machine  fwd/retro resisted walking 50# x5RT each      Knee/Hip Exercises: Standing   Forward Lunges  Right;15 reps;2 sets    Forward Lunges  Limitations  Onto BOSU    Side Lunges  Right;15 reps;2 sets    Side Lunges Limitations  Onto BOSU    Hip Abduction  Both;2 sets;10 reps    Abduction Limitations  diagonals, GTB    SLS  BLE foam, 5x10-15" holds, intermittent UE support    SLS with Vectors  x5RT foam, BLE    Gait Training  sidestepping GTB 52ft x2RT    Other Standing Knee Exercises  tandem stance foam +palov press RTB x15 reps each foot forward and each side      Knee/Hip Exercises: Supine   Knee Extension Limitations  0    Knee Flexion Limitations  121             PT Education - 11/08/17 0904    Education Details  Discussed purpose and technique of interventions throughout session.     Person(s) Educated  Patient    Methods  Explanation    Comprehension  Verbalized understanding       PT Short Term Goals - 10/13/17 1619      PT SHORT TERM GOAL #1   Title  Pt will have improved  R knee AROM from 5-105deg in order to decrease pain and maximize gait.     Time  2    Period  Weeks    Status  New    Target Date  10/27/17      PT SHORT TERM GOAL #2   Title  Pt will have decreased joint line edema by 1cm or > in order to maximize ROM and decrease pain.    Time  2    Period  Weeks    Status  New      PT SHORT TERM GOAL #3   Title  Pt will have improved 5xSTS to 10sec or < with proper form and without UE to demo improved functional hip strength and maximize her transfers.2    Time  2    Period  Weeks    Status  New        PT Long Term Goals - 10/13/17 1619      PT LONG TERM GOAL #1   Title  Pt will have improved R knee AROM from 0-115deg in order to further maximize gait and stair ambulation.    Time  4    Period  Weeks    Status  New    Target Date  11/10/17      PT LONG TERM GOAL #2   Title  Pt will have improved MMT by 1 grade throughout in order to maximize functional mobility and return to PLOF    Time  4    Period  Weeks    Status  New      PT LONG TERM GOAL #3   Title  Pt will be able  to ambulate at least 662ft during 3MWT without AD and gait WFL in order to maximize her return to community ambulation.    Time  4    Period  Weeks    Status  New      PT LONG TERM GOAL #4   Title  Pt will be able to perform bil SLS for 60 sec or > in order to demo improved core and hip strength as well as balance in order to maximize her stair ambulation and gait on uneven ground.     Time  4    Period  Weeks    Status  New            Plan - 11/08/17 0942    Clinical Impression Statement  This session continued to progress patient with exercises to improve patient's lower extremity strengthening and balance. This session added forward and lateral lunges onto the BOSU to challenge patient's balance and improve patient's strength. This session patient's right knee AROM ranged from 0-121 degrees. Plan to re-assess patient at next session.     Rehab Potential  Excellent    PT Frequency  3x / week    PT Duration  4 weeks    PT Treatment/Interventions  ADLs/Self Care Home Management;Aquatic Therapy;Cryotherapy;Electrical Stimulation;Moist Heat;Traction;Ultrasound;DME Instruction;Gait training;Functional Biochemist, clinical;Therapeutic activities;Balance training;Neuromuscular re-education;Patient/family education;Therapeutic exercise;Manual techniques;Scar mobilization;Passive range of motion;Dry needling;Energy conservation;Taping    PT Next Visit Plan  Re-assess. Continue manual as needed to decrease adhesions.   Progress strengthening as tolerated to return to normal function.      PT Home Exercise Plan  eval: quad set, heel slide, supine HS stretch; 9/25: SAQ, SLR; 10/11: prone quad stretch; 10/14: sidestepping, hip diagonals, GTB    Consulted and Agree with Plan of Care  Patient  Patient will benefit from skilled therapeutic intervention in order to improve the following deficits and impairments:  Decreased activity tolerance, Decreased balance, Decreased range of  motion, Decreased scar mobility, Decreased strength, Difficulty walking, Hypomobility, Increased edema, Increased fascial restricitons, Increased muscle spasms, Impaired flexibility, Improper body mechanics, Pain  Visit Diagnosis: Acute pain of right knee  Stiffness of right knee, not elsewhere classified  Localized edema  Muscle weakness (generalized)     Problem List Patient Active Problem List   Diagnosis Date Noted  . Hypothyroidism 03/07/2013  . Morbid obesity ([place]) 03/07/2013   Kristen Drake PT, DPT 9:44 AM, 11/08/17 [place] [address] [place], [place], [address] Phone: [phone]   Fax:  [phone]  Name: Kristen Drake MRN: [id number] Date of Birth: 11/03/1955

## 2017-11-10 ENCOUNTER — Ambulatory Visit (HOSPITAL_COMMUNITY): Payer: 59

## 2017-11-10 ENCOUNTER — Encounter (HOSPITAL_COMMUNITY): Payer: Self-pay

## 2017-11-10 DIAGNOSIS — M25561 Pain in right knee: Secondary | ICD-10-CM | POA: Diagnosis not present

## 2017-11-10 DIAGNOSIS — M6281 Muscle weakness (generalized): Secondary | ICD-10-CM

## 2017-11-10 DIAGNOSIS — R600 Localized edema: Secondary | ICD-10-CM

## 2017-11-10 DIAGNOSIS — M25661 Stiffness of right knee, not elsewhere classified: Secondary | ICD-10-CM

## 2017-11-10 NOTE — Therapy (Signed)
[place] [address] [place], [place], [address] Phone: [phone]   Fax:  [phone]   PHYSICAL THERAPY DISCHARGE SUMMARY  Visits from Start of Care: 12  Current functional level related to goals / functional outcomes: See below   Remaining deficits: See below   Education / Equipment: HEP  Plan: Patient agrees to discharge.  Patient goals were met. Patient is being discharged due to meeting the stated rehab goals.  ?????     Physical Therapy Treatment  Patient Details  Name: Kristen Drake MRN: [id number] Date of Birth: 04-10-1955 Referring Provider (PT): Vickey Huger, MD   Encounter Date: 11/10/2017  PT End of Session - 11/10/17 1351    Visit Number  12    Number of Visits  13    Date for PT Re-Evaluation  11/10/17    Authorization Type  [employer]    Authorization Time Period  10/13/17 to 11/10/17    Authorization - Visit Number  12    Authorization - Number of Visits  60    PT Start Time  1350    PT Stop Time  1414    PT Time Calculation (min)  24 min    Activity Tolerance  Patient tolerated treatment well;No increased pain    Behavior During Therapy  [facility] for tasks assessed/performed       History reviewed. No pertinent past medical history.  History reviewed. No pertinent surgical history.  There were no vitals filed for this visit.  Subjective Assessment - 11/10/17 1351    Subjective  Pt states that she's doing well. The PA-C was pleased with her progress thsu far.     Currently in Pain?  No/denies         [facility] PT Assessment - 11/10/17 0001      Assessment   Medical Diagnosis  R uni TKA    Referring Provider (PT)  Vickey Huger, MD    Onset Date/Surgical Date  10/02/17    Next MD Visit  01/10/18    Prior Therapy  none      Circumferential Edema   Circumferential - Right  50cm, joint line   was 50cm joint line     AROM   Right Knee Extension  --   was 8   Right Knee Flexion  --   was  92     Strength   Right Hip Flexion  5/5   was 4   Right Hip Extension  4+/5   was 4   Right Hip ABduction  4/5   was 4-   Left Hip Flexion  5/5   was 4+   Left Hip Extension  4+/5   was 4   Left Hip ABduction  4+/5   was 4   Right Knee Flexion  5/5   was 4-   Right Knee Extension  5/5   was 4-   Right Ankle Dorsiflexion  5/5   was 4+   Left Ankle Dorsiflexion  5/5   was 4+     Ambulation/Gait   Ambulation Distance (Feet)  722 Feet   3MWT; was 425f   Assistive device  None    Gait Pattern  Within Functional Limits      Balance   Balance Assessed  Yes      Static Standing Balance   Static Standing - Balance Support  No upper extremity supported    Static Standing Balance -  Activities   Single Leg  Stance - Right Leg;Single Leg Stance - Left Leg    Static Standing - Comment/# of Minutes  R: 48sec L: 1:02   R: 25sec, L: 32.8sec     Standardized Balance Assessment   Standardized Balance Assessment  Five Times Sit to Stand    Five times sit to stand comments   7.4sec, chair, no UE   was 12.4sec, no UE, chair           PT Education - 11/10/17 1419    Education Details  discharge, HEP    Person(s) Educated  Patient    Methods  Explanation;Demonstration;Handout    Comprehension  Verbalized understanding           PT Short Term Goals - 11/10/17 1352      PT SHORT TERM GOAL #1   Title  Pt will have improved R knee AROM from 5-105deg in order to decrease pain and maximize gait.     Baseline  10/18: 0-121    Time  2    Period  Weeks    Status  Achieved      PT SHORT TERM GOAL #2   Title  Pt will have decreased joint line edema by 1cm or > in order to maximize ROM and decrease pain.    Baseline  10/18: still 50cm jiont line    Time  2    Period  Weeks    Status  On-going      PT SHORT TERM GOAL #3   Title  Pt will have improved 5xSTS to 10sec or < with proper form and without UE to demo improved functional hip strength and maximize her transfers.2     Baseline  10/18: 7.4    Time  2    Period  Weeks    Status  Achieved        PT Long Term Goals - 11/10/17 1352      PT LONG TERM GOAL #1   Title  Pt will have improved R knee AROM from 0-115deg in order to further maximize gait and stair ambulation.    Baseline  10/18: 0-121    Time  4    Period  Weeks    Status  Achieved      PT LONG TERM GOAL #2   Title  Pt will have improved MMT by 1 grade throughout in order to maximize functional mobility and return to PLOF    Baseline  10/18: see MMT    Time  4    Period  Weeks    Status  Partially Met      PT LONG TERM GOAL #3   Title  Pt will be able to ambulate at least 667f during 3MWT without AD and gait WFL in order to maximize her return to community ambulation.    Time  4    Period  Weeks    Status  Achieved      PT LONG TERM GOAL #4   Title  Pt will be able to perform bil SLS for 60 sec or > in order to demo improved core and hip strength as well as balance in order to maximize her stair ambulation and gait on uneven ground.     Baseline  10/18: L: 1:02, R: 48sec    Time  4    Period  Weeks    Status  Partially Met            Plan - 11/10/17 1419  Clinical Impression Statement  PT reassessed pt's goals and outcome measures this date. Pt has made great progress towards all goals as illustrated above. Her AROM was 0 to 121deg, her functional strength improved AEB 5xSTS time, and her 3MWT was [facility] as she ambulated 712f with gait pattern WFL. Her knee circumferential edema measurements still 50cm at joint line and while her MMT improved in every mm group, not all improved my 1 whole grade. Her [facility] could perform for 48sec, but not quite WNL as she could not perform for at least 60seconds. Overall, pt states that she feels great, her knee is not limiting her functional ability or mobility, and she does not require anymore skilled PT intervention to address her few minor remaining deficits. PT  provided pt with extensive HEP additions and handout for local [employer] membership. Pt d/c to HEP at this time due to progress made.     Rehab Potential  Excellent    PT Frequency  3x / week    PT Duration  4 weeks    PT Treatment/Interventions  ADLs/Self Care Home Management;Aquatic Therapy;Cryotherapy;Electrical Stimulation;Moist Heat;Traction;Ultrasound;DME Instruction;Gait training;Functional mBiochemist, clinicalTherapeutic activities;Balance training;Neuromuscular re-education;Patient/family education;Therapeutic exercise;Manual techniques;Scar mobilization;Passive range of motion;Dry needling;Energy conservation;Taping    PT Next Visit Plan  d/c to HEP    PT Home Exercise Plan  eval: quad set, heel slide, supine HS stretch; 9/25: SAQ, SLR; 10/11: prone quad stretch; 10/14: sidestepping, hip diagonals, GTB; 10/18: see below for extensive additions    Consulted and Agree with Plan of Care  Patient       Patient will benefit from skilled therapeutic intervention in order to improve the following deficits and impairments:  Decreased activity tolerance, Decreased balance, Decreased range of motion, Decreased scar mobility, Decreased strength, Difficulty walking, Hypomobility, Increased edema, Increased fascial restricitons, Increased muscle spasms, Impaired flexibility, Improper body mechanics, Pain  Visit Diagnosis: Acute pain of right knee  Stiffness of right knee, not elsewhere classified  Localized edema  Muscle weakness (generalized)     Problem List Patient Active Problem List   Diagnosis Date Noted  . Hypothyroidism 03/07/2013  . Morbid [place] ([place] 03/07/2013       BGeraldine Drake, DOrangeville7[address] [place] [address]Phone: [phone]  Fax:  [phone] Name: JCADEN FATICAMRN: [id number] of Birth: 211-Jan-1957

## 2017-11-10 NOTE — Patient Instructions (Signed)
Access Code: [email or web address]  URL: [email or web address]  Date: 11/10/2017  Prepared by: Geraldine Solar   Exercises Knee Extension with Weight Machine - 10 reps - 3 sets - 1x daily - 7x weekly Single Leg Knee Extension with Weight Machine - 10 reps - 3 sets                            - 1x daily - 7x weekly Eccentric Knee Extension with Weight Machine - 10 reps - 3 sets - 1x daily - 7x weekly Hamstring Curl with Weight Machine - 10 reps - 3 sets - 1x daily - 7x weekly Single Leg Hamstring Curl with Weight Machine - 10 reps - 3 sets                            - 1x daily - 7x weekly Eccentric Hamstring Curl with Weight Machine - 10 reps - 3 sets - 1x daily - 7x weekly Side Stepping with Resistance at Ankles - 10 reps - 3 sets - 1x daily - 7x weekly Sidelying Hip Abduction - 10 reps - 3 sets - 1x daily - 7x weekly Standing Hip Abduction with Resistance at Ankles - 10 reps - 3 sets - 1x daily - 7x weekly Mini Squat with Chair - 10 reps - 3 sets - 1x daily - 7x weekly Supine Bridge - 10 reps - 3 sets - 1x daily - 7x weekly Step Up - 10 reps - 3 sets - 1x daily - 7x weekly Lateral Step Ups - 10 reps - 3 sets - 1x daily - 7x weekly Single Leg Stance - 10 reps - 3 sets - 1x daily - 7x weekly Tandem Stance in Corner - 10 reps - 3 sets - 1x daily - 7x weekly Standing 3-Way Kick - 10 reps - 3 sets - 1x daily - 7x weekly

## 2018-01-10 DIAGNOSIS — Z96651 Presence of right artificial knee joint: Secondary | ICD-10-CM | POA: Diagnosis not present

## 2018-01-10 DIAGNOSIS — Z471 Aftercare following joint replacement surgery: Secondary | ICD-10-CM | POA: Diagnosis not present

## 2018-02-12 ENCOUNTER — Other Ambulatory Visit: Payer: Self-pay | Admitting: Obstetrics and Gynecology

## 2018-02-12 DIAGNOSIS — Z1231 Encounter for screening mammogram for malignant neoplasm of breast: Secondary | ICD-10-CM

## 2018-02-22 ENCOUNTER — Ambulatory Visit: Payer: 59

## 2018-02-26 ENCOUNTER — Ambulatory Visit
Admission: RE | Admit: 2018-02-26 | Discharge: 2018-02-26 | Disposition: A | Discharge status: Home / Self Care | Payer: 59 | Source: Ambulatory Visit | Attending: Obstetrics and Gynecology | Admitting: Obstetrics and Gynecology

## 2018-02-26 DIAGNOSIS — Z1231 Encounter for screening mammogram for malignant neoplasm of breast: Secondary | ICD-10-CM

## 2018-08-03 ENCOUNTER — Telehealth: Payer: Self-pay | Admitting: Family Medicine

## 2018-08-03 ENCOUNTER — Other Ambulatory Visit (HOSPITAL_COMMUNITY): Payer: Self-pay | Admitting: Family Medicine

## 2018-08-03 DIAGNOSIS — Z79899 Other long term (current) drug therapy: Secondary | ICD-10-CM

## 2018-08-03 DIAGNOSIS — Z13220 Encounter for screening for lipoid disorders: Secondary | ICD-10-CM

## 2018-08-03 DIAGNOSIS — R5383 Other fatigue: Secondary | ICD-10-CM

## 2018-08-03 DIAGNOSIS — E039 Hypothyroidism, unspecified: Secondary | ICD-10-CM

## 2018-08-03 NOTE — Telephone Encounter (Signed)
Pt had CBC on 07/21/17; BMET, Lipid, Liver and TSH on 07/12/17. Please advise. Thank you

## 2018-08-03 NOTE — Telephone Encounter (Signed)
Pt has virtual visit schedule 7/27 with Hoyle Sauer for thyroid medication and would like to have lab work done before appt.

## 2018-08-06 NOTE — Telephone Encounter (Signed)
Blood work ordered in Epic. Patient notified. 

## 2018-08-06 NOTE — Telephone Encounter (Signed)
Please order CMP with GFR; Lipid; Liver; TSH and vitamin D

## 2018-08-15 LAB — LIPID PANEL
Chol/HDL Ratio: 2.7 ratio (ref 0.0–4.4)
Cholesterol, Total: 172 mg/dL (ref 100–199)
HDL: 64 mg/dL (ref >39)
LDL Calculated: 90 mg/dL (ref 0–99)
Triglycerides: 88 mg/dL (ref 0–149)
VLDL Cholesterol Cal: 18 mg/dL (ref 5–40)

## 2018-08-15 LAB — COMPREHENSIVE METABOLIC PANEL
ALT: 19 IU/L (ref 0–32)
AST: 19 IU/L (ref 0–40)
Albumin/Globulin Ratio: 1.9 (ref 1.2–2.2)
Albumin: 4.5 g/dL (ref 3.8–4.8)
Alkaline Phosphatase: 79 IU/L (ref 39–117)
BUN/Creatinine Ratio: 29 — ABNORMAL HIGH (ref 12–28)
BUN: 22 mg/dL (ref 8–27)
Bilirubin Total: 0.4 mg/dL (ref 0.0–1.2)
CO2: 25 mmol/L (ref 20–29)
Calcium: 10 mg/dL (ref 8.7–10.3)
Chloride: 103 mmol/L (ref 96–106)
Creatinine, Ser: 0.77 mg/dL (ref 0.57–1.00)
GFR calc Af Amer: 96 mL/min/{1.73_m2} (ref >59)
GFR calc non Af Amer: 83 mL/min/{1.73_m2} (ref >59)
Globulin, Total: 2.4 g/dL (ref 1.5–4.5)
Glucose: 91 mg/dL (ref 65–99)
Potassium: 4.8 mmol/L (ref 3.5–5.2)
Sodium: 142 mmol/L (ref 134–144)
Total Protein: 6.9 g/dL (ref 6.0–8.5)

## 2018-08-15 LAB — HEPATIC FUNCTION PANEL: Bilirubin, Direct: 0.11 mg/dL (ref 0.00–0.40)

## 2018-08-15 LAB — TSH: TSH: 2.88 u[IU]/mL (ref 0.450–4.500)

## 2018-08-15 LAB — VITAMIN D 25 HYDROXY (VIT D DEFICIENCY, FRACTURES): Vit D, 25-Hydroxy: 42.8 ng/mL (ref 30.0–100.0)

## 2018-08-16 ENCOUNTER — Other Ambulatory Visit: Payer: Self-pay

## 2018-08-20 ENCOUNTER — Other Ambulatory Visit: Payer: Self-pay

## 2018-08-20 ENCOUNTER — Ambulatory Visit (INDEPENDENT_AMBULATORY_CARE_PROVIDER_SITE_OTHER): Payer: 59 | Admitting: Nurse Practitioner

## 2018-08-20 ENCOUNTER — Encounter: Payer: Self-pay | Admitting: Nurse Practitioner

## 2018-08-20 DIAGNOSIS — E039 Hypothyroidism, unspecified: Secondary | ICD-10-CM | POA: Diagnosis not present

## 2018-08-20 NOTE — Progress Notes (Signed)
Subjective:  Virtual visit   Patient ID: Kristen Drake, female    DOB: 05/21/1955, 63 y.o.   MRN: [id number]  HPI  Patient calls for a follow up on hypothyroidism. Patient states she is doing well on her current medication and has no problems or concerns.  Virtual Visit via Video Note  I connected with Kristen Drake on 08/20/18 at  9:20 AM EDT by a video enabled telemedicine application and verified that I am speaking with the correct person using two identifiers.  Location: Patient: home Provider: office   I discussed the limitations of evaluation and management by telemedicine and the availability of in person appointments. The patient expressed understanding and agreed to proceed.  History of Present Illness: Presents for recheck on hypothyroidism.  Adherent to medication regimen.  Has lost about 28 pounds since November.  Patient is currently divorcing her husband, states she has a good support system and is doing well.  Staying busy.  States her energy level is "great".  Denies any difficulty swallowing.   Observations/Objective: NAD.  Alert, oriented.  Cheerful affect.  Making good eye contact.  Thoughts logical coherent and relevant.  Patient pressed on the area of the thyroid gland, denies any tenderness. Reviewed labs with patient from 08/14/2018.  A copy will be sent to patient per her request. Recent Results (from the past 2160 hour(s))  Comprehensive metabolic panel     Status: Abnormal   Collection Time: 08/14/18  9:26 AM  Result Value Ref Range   Glucose 91 65 - 99 mg/dL   BUN 22 8 - 27 mg/dL   Creatinine, Ser 0.77 0.57 - 1.00 mg/dL   GFR calc non Af Amer 83 >59 mL/min/1.73   GFR calc Af Amer 96 >59 mL/min/1.73   BUN/Creatinine Ratio 29 (H) 12 - 28   Sodium 142 134 - 144 mmol/L   Potassium 4.8 3.5 - 5.2 mmol/L   Chloride 103 96 - 106 mmol/L   CO2 25 20 - 29 mmol/L   Calcium 10.0 8.7 - 10.3 mg/dL   Total Protein 6.9 6.0 - 8.5 g/dL   Albumin 4.5 3.8 - 4.8 g/dL   Globulin, Total 2.4 1.5 - 4.5 g/dL   Albumin/Globulin Ratio 1.9 1.2 - 2.2   Bilirubin Total 0.4 0.0 - 1.2 mg/dL   Alkaline Phosphatase 79 39 - 117 IU/L   AST 19 0 - 40 IU/L   ALT 19 0 - 32 IU/L  Lipid panel     Status: None   Collection Time: 08/14/18  9:26 AM  Result Value Ref Range   Cholesterol, Total 172 100 - 199 mg/dL   Triglycerides 88 0 - 149 mg/dL   HDL 64 >39 mg/dL   VLDL Cholesterol Cal 18 5 - 40 mg/dL   LDL Calculated 90 0 - 99 mg/dL   Chol/HDL Ratio 2.7 0.0 - 4.4 ratio    Comment:                                   T. Chol/HDL Ratio                                             Men  Women  1/2 Avg.Risk  3.4    3.3                                   Avg.Risk  5.0    4.4                                2X Avg.Risk  9.6    7.1                                3X Avg.Risk 23.4   11.0   TSH     Status: None   Collection Time: 08/14/18  9:26 AM  Result Value Ref Range   TSH 2.880 0.450 - 4.500 uIU/mL  VITAMIN D 25 Hydroxy (Vit-D Deficiency, Fractures)     Status: None   Collection Time: 08/14/18  9:26 AM  Result Value Ref Range   Vit D, 25-Hydroxy 42.8 30.0 - 100.0 ng/mL    Comment: Vitamin D deficiency has been defined by the [place] and an Endocrine Society practice guideline as a level of serum 25-OH vitamin D less than 20 ng/mL (1,2). The Endocrine Society went on to further define vitamin D insufficiency as a level between 21 and 29 ng/mL (2). 1. IOM (Institute of Medicine). 2010. Dietary reference    intakes for calcium and D. [place]: The    [employer]. 2. Holick MF, Binkley Osmond, Bischoff-Ferrari HA, et al.    Evaluation, treatment, and prevention of vitamin D    deficiency: an Endocrine Society clinical practice    guideline. JCEM. 2011 Jul; 96(7):1911-30.   Hepatic function panel     Status: None   Collection Time: 08/14/18  9:26 AM  Result Value Ref Range   Bilirubin, Direct 0.11 0.00 - 0.40 mg/dL      Assessment and Plan: Problem List Items Addressed This Visit      Endocrine   Hypothyroidism - Primary       Follow Up Instructions: Continue activity and weight loss efforts.  Patient gets her gynecological exams with another provider. Return in about 1 year (around 08/20/2019) for recheck .    I discussed the assessment and treatment plan with the patient. The patient was provided an opportunity to ask questions and all were answered. The patient agreed with the plan and demonstrated an understanding of the instructions.   The patient was advised to call back or seek an in-person evaluation if the symptoms worsen or if the condition fails to improve as anticipated.  I provided 15 minutes of non-face-to-face time during this encounter.      Review of Systems     Objective:   Physical Exam        Assessment & Plan:

## 2018-09-19 ENCOUNTER — Other Ambulatory Visit: Payer: Self-pay

## 2018-09-19 ENCOUNTER — Ambulatory Visit
Admission: EM | Admit: 2018-09-19 | Discharge: 2018-09-19 | Disposition: A | Discharge status: Home / Self Care | Payer: 59

## 2018-09-19 DIAGNOSIS — H6121 Impacted cerumen, right ear: Secondary | ICD-10-CM

## 2018-09-19 DIAGNOSIS — H9191 Unspecified hearing loss, right ear: Secondary | ICD-10-CM | POA: Diagnosis not present

## 2018-09-19 DIAGNOSIS — R030 Elevated blood-pressure reading, without diagnosis of hypertension: Secondary | ICD-10-CM | POA: Diagnosis not present

## 2018-09-19 NOTE — ED Provider Notes (Signed)
[place]   [id number] 09/19/18 Arrival Time: [id number]  CC:EAR PAIN  SUBJECTIVE: History from: patient.  Kristen Drake is a 63 y.o. female who presents with RT ear fullness x 2 days.  States she may have gotten some water in her ear while showering.  Denies ear pain.  Patient has tried cleaning ear with peroxide without relief.  Denies aggravating factors.  Reports similar symptoms in the past that improved with ear lavage.  Complains of associated decreased hearing from RT ear.  Denies fever, chills, fatigue, congestion, rhinorrhea, ear discharge, sore throat, SOB, chest pain, nausea, changes in bowel or bladder habits.    ROS: As per HPI.  All other pertinent ROS negative.     History reviewed. No pertinent past medical history. History reviewed. No pertinent surgical history. No Known Allergies No current facility-administered medications on file prior to encounter.    Current Outpatient Medications on File Prior to Encounter  Medication Sig Dispense Refill  . aspirin 81 MG tablet Take 81 mg by mouth daily.    . Cyanocobalamin (VITAMIN B 12 PO) Take by mouth daily.    . hydrochlorothiazide (HYDRODIURIL) 25 MG tablet TAKE 1 TABLET DAILY AS NEEDED FOR SWELLING 90 tablet 1  . levothyroxine (SYNTHROID) 75 MCG tablet TAKE ONE (1) TABLET BY MOUTH EVERY DAY 90 tablet 3  . naproxen sodium (ANAPROX) 220 MG tablet Take 220 mg by mouth 2 (two) times daily with a meal.    . OVER THE COUNTER MEDICATION Vitamin D one daily    . OVER THE COUNTER MEDICATION Calcium 600 mg 2 a day Hair, skin, nails vit 3 a day Iron 325mg  one a day Vit b complex one a day    . [DISCONTINUED] FEMHRT LOW DOSE 0.5-2.5 MG-MCG per tablet      Social History   Socioeconomic History  . Marital status: Married    Spouse name: Not on file  . Number of children: Not on file  . Years of education: Not on file  . Highest education level: Not on file  Occupational History  . Not on file  Social Needs  .  Financial resource strain: Not on file  . Food insecurity    Worry: Not on file    Inability: Not on file  . Transportation needs    Medical: Not on file    Non-medical: Not on file  Tobacco Use  . Smoking status: Never Smoker  . Smokeless tobacco: Never Used  Substance and Sexual Activity  . Alcohol use: Not on file  . Drug use: Not on file  . Sexual activity: Not on file  Lifestyle  . Physical activity    Days per week: Not on file    Minutes per session: Not on file  . Stress: Not on file  Relationships  . Social Herbalist on phone: Not on file    Gets together: Not on file    Attends religious service: Not on file    Active member of club or organization: Not on file    Attends meetings of clubs or organizations: Not on file    Relationship status: Not on file  . Intimate partner violence    Fear of current or ex partner: Not on file    Emotionally abused: Not on file    Physically abused: Not on file    Forced sexual activity: Not on file  Other Topics Concern  . Not on file  Social History Narrative  .  Not on file   Family History  Problem Relation Age of Onset  . Breast cancer Neg Hx     OBJECTIVE:  Vitals:   09/19/18 1130 09/19/18 1139  BP: (!) 171/112 (!) 155/87  Resp: 18   Temp: 98.1 F (36.7 C)   SpO2: 97%      General appearance: alert; well-appearing, nontoxic HEENT: Ears: LT EAC clear, LT TM pearly gray with visible cone of light, without erythema, RT EAC with cerumen impaction; Eyes: PERRL, EOMI grossly; Nose: patent without rhinorrhea; Throat: oropharynx clear, tonsils not enlarged or erythmeatous, uvula midline Neck: supple without LAD Lungs: unlabored respirations, symmetrical air entry; cough: absent; no respiratory distress Heart: regular rate and rhythm.   Skin: warm and dry Psychological: alert and cooperative; normal mood and affect   PROCEDURE:  Consent granted.  Attempted RT ear wax removal with curette without  relief.  Right ear lavage performed by RN.  TM visualized.  PT tolerated procedure well.      ASSESSMENT & PLAN:  1. Impacted cerumen of right ear   2. Elevated blood pressure reading     RT Ear lavage performed in office Rest and push fluids You may use OTC debrox ear solution to help prevent ear wax impaction Follow up with PCP as needed Return here or go to the ER if you have any new or worsening symptoms ear pain, fever, chills, nausea, vomiting, changes in hearing, persistent symptoms despite treatment, etc...  Blood pressure elevated in office.  Please recheck in 24 hours.  If it continues to be greater than 140/90 please follow up with PCP for further evaluation and management.    Reviewed expectations re: course of current medical issues. Questions answered. Outlined signs and symptoms indicating need for more acute intervention. Patient verbalized understanding. After Visit Summary given.         Lestine Box, PA-C 09/19/18 1147

## 2018-09-19 NOTE — Discharge Instructions (Signed)
RT Ear lavage performed in office Rest and push fluids You may use OTC debrox ear solution to help prevent ear wax impaction Follow up with PCP as needed Return here or go to the ER if you have any new or worsening symptoms ear pain, fever, chills, nausea, vomiting, changes in hearing, persistent symptoms despite treatment, etc...  Blood pressure elevated in office.  Please recheck in 24 hours.  If it continues to be greater than 140/90 please follow up with PCP for further evaluation and management.

## 2018-09-19 NOTE — ED Triage Notes (Signed)
Pt has ear fullness for past 2 days

## 2018-09-28 ENCOUNTER — Other Ambulatory Visit: Payer: Self-pay

## 2018-09-28 ENCOUNTER — Ambulatory Visit
Admission: EM | Admit: 2018-09-28 | Discharge: 2018-09-28 | Disposition: A | Discharge status: Home / Self Care | Payer: 59 | Attending: Emergency Medicine | Admitting: Emergency Medicine

## 2018-09-28 DIAGNOSIS — S50362A Insect bite (nonvenomous) of left elbow, initial encounter: Secondary | ICD-10-CM

## 2018-09-28 DIAGNOSIS — L03114 Cellulitis of left upper limb: Secondary | ICD-10-CM | POA: Diagnosis not present

## 2018-09-28 MED ORDER — DOXYCYCLINE HYCLATE 100 MG PO CAPS: 100.0000 mg | ORAL_CAPSULE | Freq: Two times a day (BID) | ORAL | 0 refills | Status: DC

## 2018-09-28 NOTE — ED Triage Notes (Signed)
Pt has bite with halo type rash in crease of left elbow

## 2018-09-28 NOTE — ED Provider Notes (Signed)
[place]   [id number] 09/28/18 Arrival Time: 1022  CC: Rash  SUBJECTIVE:  Kristen Drake is a 63 y.o. female who presents with a rash to left elbow x 5 days.  Denies precipitating event or trauma, but speculates a insect may have bit her.  Localizes the rash to medial left elbow.  Describes it as itchy, red, increasing in size, and now painful.  Has tried OTC steroid cream with minimal relief.  Symptoms are made worse to the touch.  Denies fever, chills, nausea, vomiting, discharge, SOB, chest pain, abdominal pain, changes in bowel or bladder function.    ROS: As per HPI.  All other pertinent ROS negative.     History reviewed. No pertinent past medical history. History reviewed. No pertinent surgical history. No Known Allergies No current facility-administered medications on file prior to encounter.    Current Outpatient Medications on File Prior to Encounter  Medication Sig Dispense Refill  . aspirin 81 MG tablet Take 81 mg by mouth daily.    . Cyanocobalamin (VITAMIN B 12 PO) Take by mouth daily.    . hydrochlorothiazide (HYDRODIURIL) 25 MG tablet TAKE 1 TABLET DAILY AS NEEDED FOR SWELLING 90 tablet 1  . levothyroxine (SYNTHROID) 75 MCG tablet TAKE ONE (1) TABLET BY MOUTH EVERY DAY 90 tablet 3  . naproxen sodium (ANAPROX) 220 MG tablet Take 220 mg by mouth 2 (two) times daily with a meal.    . OVER THE COUNTER MEDICATION Vitamin D one daily    . OVER THE COUNTER MEDICATION Calcium 600 mg 2 a day Hair, skin, nails vit 3 a day Iron 325mg  one a day Vit b complex one a day    . [DISCONTINUED] FEMHRT LOW DOSE 0.5-2.5 MG-MCG per tablet      Social History   Socioeconomic History  . Marital status: Married    Spouse name: Not on file  . Number of children: Not on file  . Years of education: Not on file  . Highest education level: Not on file  Occupational History  . Not on file  Social Needs  . Financial resource strain: Not on file  . Food insecurity    Worry: Not  on file    Inability: Not on file  . Transportation needs    Medical: Not on file    Non-medical: Not on file  Tobacco Use  . Smoking status: Never Smoker  . Smokeless tobacco: Never Used  Substance and Sexual Activity  . Alcohol use: Not on file  . Drug use: Not on file  . Sexual activity: Not on file  Lifestyle  . Physical activity    Days per week: Not on file    Minutes per session: Not on file  . Stress: Not on file  Relationships  . Social Herbalist on phone: Not on file    Gets together: Not on file    Attends religious service: Not on file    Active member of club or organization: Not on file    Attends meetings of clubs or organizations: Not on file    Relationship status: Not on file  . Intimate partner violence    Fear of current or ex partner: Not on file    Emotionally abused: Not on file    Physically abused: Not on file    Forced sexual activity: Not on file  Other Topics Concern  . Not on file  Social History Narrative  . Not on file  Family History  Problem Relation Age of Onset  . Breast cancer Neg Hx     OBJECTIVE: Vitals:   09/28/18 1034  BP: (!) 158/97  Pulse: 69  Resp: 18  Temp: 98.4 F (36.9 C)  SpO2: 97%    General appearance: alert; no distress Head: NCAT Lungs: clear to auscultation bilaterally Heart: regular rate and rhythm.  Radial pulse 2+ bilaterally Extremities: no edema Skin: 6 x 6 cm area of erythema localized to medial left elbow, mildly TTP, no obvious drainage or bleeding Psychological: alert and cooperative; normal mood and affect  ASSESSMENT & PLAN:  1. Cellulitis of left arm   2. Insect bite of left elbow, initial encounter     Meds ordered this encounter  Medications  . doxycycline (VIBRAMYCIN) 100 MG capsule    Sig: Take 1 capsule (100 mg total) by mouth 2 (two) times daily.    Dispense:  20 capsule    Refill:  0    Order Specific Question:   Supervising Provider    Answer:   Raylene Everts  [id number]   Wash with warm water and mild soap Keep covered to avoid friction Prescribed doxycycline take as directed and to completion Continue to alternate ibuprofen and tylenol as needed for pain and fever Use OTC zyrtec, benadryl, or calamine lotion as needed for itching Follow up with PCP if symptoms persists Return or go to the ED if you have any new or worsening symptoms such as increased pain, redness, swelling, discharge, high fever, night sweats, abdominal pain, etc...   Reviewed expectations re: course of current medical issues. Questions answered. Outlined signs and symptoms indicating need for more acute intervention. Patient verbalized understanding. After Visit Summary given.   Lestine Box, PA-C 09/28/18 1053

## 2018-09-28 NOTE — Discharge Instructions (Signed)
Wash with warm water and mild soap Keep covered to avoid friction Prescribed doxycycline take as directed and to completion Continue to alternate ibuprofen and tylenol as needed for pain and fever Use OTC zyrtec, benadryl, or calamine lotion as needed for itching Follow up with PCP if symptoms persists Return or go to the ED if you have any new or worsening symptoms such as increased pain, redness, swelling, discharge, high fever, night sweats, abdominal pain, etc..Marland Kitchen

## 2019-02-19 ENCOUNTER — Other Ambulatory Visit: Payer: Self-pay | Admitting: Obstetrics and Gynecology

## 2019-02-19 DIAGNOSIS — Z1231 Encounter for screening mammogram for malignant neoplasm of breast: Secondary | ICD-10-CM

## 2019-03-29 ENCOUNTER — Other Ambulatory Visit: Payer: Self-pay

## 2019-03-29 ENCOUNTER — Ambulatory Visit
Admission: RE | Admit: 2019-03-29 | Discharge: 2019-03-29 | Disposition: A | Discharge status: Home / Self Care | Payer: 59 | Source: Ambulatory Visit | Attending: Obstetrics and Gynecology | Admitting: Obstetrics and Gynecology

## 2019-03-29 DIAGNOSIS — Z1231 Encounter for screening mammogram for malignant neoplasm of breast: Secondary | ICD-10-CM

## 2019-07-23 ENCOUNTER — Other Ambulatory Visit (HOSPITAL_COMMUNITY): Payer: Self-pay | Admitting: Nurse Practitioner

## 2019-07-23 NOTE — Telephone Encounter (Signed)
lvm to schedule appt.  

## 2019-07-23 NOTE — Telephone Encounter (Signed)
Scheduled 7/16 with Hoyle Sauer.   Pt would like to know about lab work

## 2019-07-23 NOTE — Telephone Encounter (Signed)
Last labs 08/14/18: vit D, liver, lipid, tsh and cmp

## 2019-07-24 NOTE — Telephone Encounter (Signed)
Please reorder previous labs; not sure if CMP is going through. If so, that is fine. If not, switch to met 7 and liver. Thanks.

## 2019-07-24 NOTE — Telephone Encounter (Signed)
One quick note, if a CMP is ordered, that takes the place of Met 7 and Liver.

## 2019-08-02 ENCOUNTER — Telehealth: Payer: Self-pay | Admitting: Family Medicine

## 2019-08-02 DIAGNOSIS — Z79899 Other long term (current) drug therapy: Secondary | ICD-10-CM

## 2019-08-02 DIAGNOSIS — E039 Hypothyroidism, unspecified: Secondary | ICD-10-CM

## 2019-08-02 DIAGNOSIS — Z13220 Encounter for screening for lipoid disorders: Secondary | ICD-10-CM

## 2019-08-02 DIAGNOSIS — R5383 Other fatigue: Secondary | ICD-10-CM

## 2019-08-02 NOTE — Telephone Encounter (Signed)
Pt has appt on 7/22 with Hoyle Sauer and would like lab work done.

## 2019-08-02 NOTE — Telephone Encounter (Signed)
Please repeat labs from 08/14/18. Thanks.

## 2019-08-02 NOTE — Telephone Encounter (Signed)
Last labs 08/14/18- Lipid, Liver, Met 7, TSH, Vit D

## 2019-08-02 NOTE — Telephone Encounter (Signed)
Blood work ordered in Epic. Patient notified. 

## 2019-08-09 ENCOUNTER — Ambulatory Visit: Payer: 59 | Admitting: Nurse Practitioner

## 2019-08-10 LAB — HEPATIC FUNCTION PANEL
ALT: 18 IU/L (ref 0–32)
AST: 18 IU/L (ref 0–40)
Albumin: 4.6 g/dL (ref 3.8–4.8)
Alkaline Phosphatase: 93 IU/L (ref 48–121)
Bilirubin Total: 0.4 mg/dL (ref 0.0–1.2)
Bilirubin, Direct: 0.11 mg/dL (ref 0.00–0.40)
Total Protein: 7.3 g/dL (ref 6.0–8.5)

## 2019-08-10 LAB — LIPID PANEL
Chol/HDL Ratio: 2.5 ratio (ref 0.0–4.4)
Cholesterol, Total: 180 mg/dL (ref 100–199)
HDL: 71 mg/dL (ref >39)
LDL Chol Calc (NIH): 94 mg/dL (ref 0–99)
Triglycerides: 85 mg/dL (ref 0–149)
VLDL Cholesterol Cal: 15 mg/dL (ref 5–40)

## 2019-08-10 LAB — VITAMIN D 25 HYDROXY (VIT D DEFICIENCY, FRACTURES): Vit D, 25-Hydroxy: 25.5 ng/mL — ABNORMAL LOW (ref 30.0–100.0)

## 2019-08-10 LAB — BASIC METABOLIC PANEL
BUN/Creatinine Ratio: 19 (ref 12–28)
BUN: 17 mg/dL (ref 8–27)
CO2: 24 mmol/L (ref 20–29)
Calcium: 9.7 mg/dL (ref 8.7–10.3)
Chloride: 102 mmol/L (ref 96–106)
Creatinine, Ser: 0.9 mg/dL (ref 0.57–1.00)
GFR calc Af Amer: 79 mL/min/{1.73_m2} (ref >59)
GFR calc non Af Amer: 68 mL/min/{1.73_m2} (ref >59)
Glucose: 94 mg/dL (ref 65–99)
Potassium: 4.4 mmol/L (ref 3.5–5.2)
Sodium: 144 mmol/L (ref 134–144)

## 2019-08-10 LAB — TSH: TSH: 3.27 u[IU]/mL (ref 0.450–4.500)

## 2019-08-15 ENCOUNTER — Other Ambulatory Visit: Payer: Self-pay

## 2019-08-15 ENCOUNTER — Ambulatory Visit: Payer: 59 | Admitting: Nurse Practitioner

## 2019-08-15 ENCOUNTER — Encounter: Payer: Self-pay | Admitting: Nurse Practitioner

## 2019-08-15 VITALS — BP 150/86 | Temp 96.8°F | Ht 64.0 in | Wt 242.0 lb

## 2019-08-15 DIAGNOSIS — I10 Essential (primary) hypertension: Secondary | ICD-10-CM | POA: Diagnosis not present

## 2019-08-15 NOTE — Progress Notes (Signed)
Subjective:    Patient ID: Kristen Drake, female    DOB: 1955-05-03, 64 y.o.   MRN: [id number]  HPI Pt here follow up on blood work and thyroid. Pt has had some increased blood pressure over the past week.  BP outside the office initially on 7/12 was running 1 54-1 60/79-89.  Started her hydroxyzine 25 mg at nighttime, BP been ran 106-129/70-79.  Pulses run 59-68.  Denies chest pain/ischemic type pain, unusual shortness of breath or edema.  Last eye exam was normal.  Is due for her wellness exam.  Has been under increased stress lately due to family issues but feels she can focus on her health at this point.  Review of Systems     Objective:   Physical Exam NAD.  Alert, oriented.  Lungs clear.  Heart regular rate rhythm.  Carotids no bruits or thrills.  Lower extremities no edema.  BP on recheck right arm sitting 150/86.  Recent Results (from the past 2160 hour(s))  Lipid panel     Status: None   Collection Time: 08/09/19  8:32 AM  Result Value Ref Range   Cholesterol, Total 180 100 - 199 mg/dL   Triglycerides 85 0 - 149 mg/dL   HDL 71 >39 mg/dL   VLDL Cholesterol Cal 15 5 - 40 mg/dL   LDL Chol Calc (NIH) 94 0 - 99 mg/dL   Chol/HDL Ratio 2.5 0.0 - 4.4 ratio    Comment:                                   T. Chol/HDL Ratio                                             Men  Women                               1/2 Avg.Risk  3.4    3.3                                   Avg.Risk  5.0    4.4                                2X Avg.Risk  9.6    7.1                                3X Avg.Risk 23.4   11.0   Hepatic function panel     Status: None   Collection Time: 08/09/19  8:32 AM  Result Value Ref Range   Total Protein 7.3 6.0 - 8.5 g/dL   Albumin 4.6 3.8 - 4.8 g/dL   Bilirubin Total 0.4 0.0 - 1.2 mg/dL   Bilirubin, Direct 0.11 0.00 - 0.40 mg/dL   Alkaline Phosphatase 93 48 - 121 IU/L   AST 18 0 - 40 IU/L   ALT 18 0 - 32 IU/L  Basic metabolic panel     Status: None   Collection Time:  08/09/19  8:32 AM  Result Value Ref Range  Glucose 94 65 - 99 mg/dL   BUN 17 8 - 27 mg/dL   Creatinine, Ser 0.90 0.57 - 1.00 mg/dL   GFR calc non Af Amer 68 >59 mL/min/1.73   GFR calc Af Amer 79 >59 mL/min/1.73    Comment: **[facility] currently reports eGFR in compliance with the current**   recommendations of the [employer]. [facility] will   update reporting as new guidelines are published from the NKF-ASN   Task force.    BUN/Creatinine Ratio 19 12 - 28   Sodium 144 134 - 144 mmol/L   Potassium 4.4 3.5 - 5.2 mmol/L   Chloride 102 96 - 106 mmol/L   CO2 24 20 - 29 mmol/L   Calcium 9.7 8.7 - 10.3 mg/dL  TSH     Status: None   Collection Time: 08/09/19  8:32 AM  Result Value Ref Range   TSH 3.270 0.450 - 4.500 uIU/mL  VITAMIN D 25 Hydroxy (Vit-D Deficiency, Fractures)     Status: Abnormal   Collection Time: 08/09/19  8:32 AM  Result Value Ref Range   Vit D, 25-Hydroxy 25.5 (L) 30.0 - 100.0 ng/mL    Comment: Vitamin D deficiency has been defined by the [place] and an Endocrine Society practice guideline as a level of serum 25-OH vitamin D less than 20 ng/mL (1,2). The Endocrine Society went on to further define vitamin D insufficiency as a level between 21 and 29 ng/mL (2). 1. IOM (Institute of Medicine). 2010. Dietary reference    intakes for calcium and D. [place]: The    [employer]. 2. Holick MF, Binkley [place], Bischoff-Ferrari HA, et al.    Evaluation, treatment, and prevention of vitamin D    deficiency: an Endocrine Society clinical practice    guideline. JCEM. 2011 Jul; 96(7):1911-30.    Reviewed labs with patient during visit.      Assessment & Plan:   Problem List Items Addressed This Visit      Cardiovascular and Mediastinum   Essential hypertension - Primary   Relevant Medications   lisinopril (ZESTRIL) 5 MG tablet     Meds ordered this encounter  Medications  . lisinopril (ZESTRIL) 5 MG tablet    Sig: Take 1  tablet (5 mg total) by mouth daily. For BP    Dispense:  90 tablet    Refill:  0    Order Specific Question:   Supervising Provider    Answer:   Sallee Lange A [[id number]]   Add low-dose lisinopril to regimen and continue to monitor BP.  If BP drops too low, DC lisinopril and contact office. Encouraged regular activity and a weight loss goal of 10 pounds. Restart vitamin D supplement. Encouraged Shingrix vaccine at local pharmacy. Strongly recommend wellness exam this fall. Return in about 3 months (around 11/15/2019). Call back sooner if any problems.

## 2019-08-16 ENCOUNTER — Encounter: Payer: Self-pay | Admitting: Nurse Practitioner

## 2019-08-16 ENCOUNTER — Telehealth: Payer: Self-pay | Admitting: Family Medicine

## 2019-08-16 MED ORDER — LISINOPRIL 5 MG PO TABS: 5.0000 mg | ORAL_TABLET | Freq: Every day | ORAL | 0 refills | Status: DC

## 2019-08-16 NOTE — Telephone Encounter (Signed)
[place] and said that Kristen Drake was supposed to have a blood pressure med called in yesterday and it was never received.   [place]

## 2019-08-16 NOTE — Telephone Encounter (Signed)
Called charles at [facility] and they received rx this morning.

## 2019-08-30 ENCOUNTER — Ambulatory Visit
Admission: RE | Admit: 2019-08-30 | Discharge: 2019-08-30 | Disposition: A | Discharge status: Home / Self Care | Payer: 59 | Source: Ambulatory Visit | Attending: Emergency Medicine | Admitting: Emergency Medicine

## 2019-08-30 ENCOUNTER — Other Ambulatory Visit: Payer: Self-pay

## 2019-08-30 DIAGNOSIS — L237 Allergic contact dermatitis due to plants, except food: Secondary | ICD-10-CM

## 2019-08-30 DIAGNOSIS — L299 Pruritus, unspecified: Secondary | ICD-10-CM

## 2019-08-30 MED ORDER — PREDNISONE 20 MG PO TABS: 20.0000 mg | ORAL_TABLET | Freq: Two times a day (BID) | ORAL | 0 refills | Status: AC

## 2019-08-30 NOTE — ED Triage Notes (Signed)
Pt presents with rash on arms after working in yard this week

## 2019-08-30 NOTE — Discharge Instructions (Signed)
Wash with warm water and mild soap Prednisone prescribed.  Take as directed and to completion Use OTC zyrtec, allegra, or claritin during the day.  Benadryl at night. You may also use OTC hydrocortisone cream and/or calamine lotion to help alleviate itching Follow up with PCP if symptoms persists  Return or go to the ED if you have any new or worsening symptoms such as fever, chills, nausea, vomiting, difficulty breathing, throat swelling, tongue swelling, numbness/ tingling in mouth, worsening symptoms despite treatment, etc..Marland Kitchen

## 2019-08-30 NOTE — ED Provider Notes (Signed)
[place]   [id number] 08/30/19 Arrival Time: 6  CC: Poison ivy  SUBJECTIVE:  Kristen Drake is a 64 y.o. female who presents with a poison ivy exposure couple days ago.  Localizes the rash to bilateral forearms.  Describes it as itchy.  Has tried OTC medications with minimal relief.  Symptoms are made worse with scratching.  Reports similar symptoms in the past that improved with steroid.   Denies fever, chills, nausea, vomiting, discharge, SOB, chest pain, abdominal pain, changes in bowel or bladder function.    ROS: As per HPI.  All other pertinent ROS negative.     History reviewed. No pertinent past medical history. History reviewed. No pertinent surgical history. No Known Allergies No current facility-administered medications on file prior to encounter.   Current Outpatient Medications on File Prior to Encounter  Medication Sig Dispense Refill  . aspirin 81 MG tablet Take 81 mg by mouth daily.    . Cyanocobalamin (VITAMIN B 12 PO) Take by mouth daily.    . hydrochlorothiazide (HYDRODIURIL) 25 MG tablet TAKE 1 TABLET DAILY AS NEEDED FOR SWELLING 90 tablet 1  . levothyroxine (SYNTHROID) 75 MCG tablet TAKE ONE (1) TABLET BY MOUTH EVERY DAY 90 tablet 3  . lisinopril (ZESTRIL) 5 MG tablet Take 1 tablet (5 mg total) by mouth daily. For BP 90 tablet 0  . naproxen sodium (ANAPROX) 220 MG tablet Take 220 mg by mouth 2 (two) times daily with a meal.    . nystatin-triamcinolone ointment (MYCOLOG) Apply 1 application topically 2 (two) times daily.    Marland Kitchen OVER THE COUNTER MEDICATION Vitamin D one daily    . OVER THE COUNTER MEDICATION Calcium 600 mg 2 a day Hair, skin, nails vit 3 a day Iron 325mg  one a day Vit b complex one a day    . [DISCONTINUED] FEMHRT LOW DOSE 0.5-2.5 MG-MCG per tablet      Social History   Socioeconomic History  . Marital status: Married    Spouse name: Not on file  . Number of children: Not on file  . Years of education: Not on file  . Highest  education level: Not on file  Occupational History  . Not on file  Tobacco Use  . Smoking status: Never Smoker  . Smokeless tobacco: Never Used  Substance and Sexual Activity  . Alcohol use: Not Currently  . Drug use: Never  . Sexual activity: Not on file  Other Topics Concern  . Not on file  Social History Narrative  . Not on file   Social Determinants of Health   Financial Resource Strain:   . Difficulty of Paying Living Expenses:   Food Insecurity:   . Worried About Charity fundraiser in the Last Year:   . Arboriculturist in the Last Year:   Transportation Needs:   . Film/video editor (Medical):   Marland Kitchen Lack of Transportation (Non-Medical):   Physical Activity:   . Days of Exercise per Week:   . Minutes of Exercise per Session:   Stress:   . Feeling of Stress :   Social Connections:   . Frequency of Communication with Friends and Family:   . Frequency of Social Gatherings with Friends and Family:   . Attends Religious Services:   . Active Member of Clubs or Organizations:   . Attends Archivist Meetings:   Marland Kitchen Marital Status:   Intimate Partner Violence:   . Fear of Current or Ex-Partner:   .  Emotionally Abused:   Marland Kitchen Physically Abused:   . Sexually Abused:    Family History  Problem Relation Age of Onset  . Breast cancer Neg Hx     OBJECTIVE: There were no vitals filed for this visit.  General appearance: alert; no distress Head: NCAT Lungs: normal respiratory effort Extremities: no edema Skin: warm and dry; areas of linear papules and vesicles with surrounding erythema to bilateral forearms Psychological: alert and cooperative; normal mood and affect  ASSESSMENT & PLAN:  1. Poison ivy dermatitis   2. Itching     Meds ordered this encounter  Medications  . predniSONE (DELTASONE) 20 MG tablet    Sig: Take 1 tablet (20 mg total) by mouth 2 (two) times daily with a meal for 5 days.    Dispense:  10 tablet    Refill:  0    Order Specific  Question:   Supervising Provider    Answer:   Raylene Everts [[id number]]   Wash with warm water and mild soap Prednisone prescribed.  Take as directed and to completion Use OTC zyrtec, allegra, or claritin during the day.  Benadryl at night. You may also use OTC hydrocortisone cream and/or calamine lotion to help alleviate itching Follow up with PCP if symptoms persists  Return or go to the ED if you have any new or worsening symptoms such as fever, chills, nausea, vomiting, difficulty breathing, throat swelling, tongue swelling, numbness/ tingling in mouth, worsening symptoms despite treatment, etc...   Reviewed expectations re: course of current medical issues. Questions answered. Outlined signs and symptoms indicating need for more acute intervention. Patient verbalized understanding. After Visit Summary given.   Lestine Box, PA-C 08/30/19 1231

## 2019-09-13 ENCOUNTER — Telehealth: Payer: Self-pay | Admitting: Family Medicine

## 2019-09-13 NOTE — Telephone Encounter (Signed)
Pt calling to make Kristen Drake aware her systolic is still a little elevated in the mid 120s after being on the lisinopril for 2-3 weeks. She is wanting to make sure she doesn't need to adjust medication.

## 2019-09-13 NOTE — Telephone Encounter (Signed)
This is good! Keep taking Lisinopril and working on weight and activity. Thanks.

## 2019-09-13 NOTE — Telephone Encounter (Signed)
Tried to call no answer

## 2019-09-13 NOTE — Telephone Encounter (Signed)
120s or 150s? Thanks

## 2019-09-13 NOTE — Telephone Encounter (Signed)
Pt states she has lost 8 lbs about and systolic is down to 122'Q.

## 2019-09-16 NOTE — Telephone Encounter (Signed)
Patient notified of Carolyn's recommendation.

## 2019-09-16 NOTE — Telephone Encounter (Signed)
T/c no answer.

## 2019-09-19 ENCOUNTER — Other Ambulatory Visit: Payer: Self-pay | Admitting: Nurse Practitioner

## 2019-10-24 ENCOUNTER — Other Ambulatory Visit: Payer: Self-pay | Admitting: Nurse Practitioner

## 2019-11-14 ENCOUNTER — Other Ambulatory Visit: Payer: Self-pay | Admitting: Nurse Practitioner

## 2019-11-15 ENCOUNTER — Ambulatory Visit: Payer: 59 | Admitting: Nurse Practitioner

## 2019-11-15 ENCOUNTER — Encounter: Payer: Self-pay | Admitting: Nurse Practitioner

## 2019-11-15 ENCOUNTER — Other Ambulatory Visit: Payer: Self-pay

## 2019-11-15 VITALS — BP 138/88 | HR 97 | Temp 96.3°F | Wt 226.2 lb

## 2019-11-15 DIAGNOSIS — I10 Essential (primary) hypertension: Secondary | ICD-10-CM | POA: Diagnosis not present

## 2019-11-15 DIAGNOSIS — Z23 Encounter for immunization: Secondary | ICD-10-CM | POA: Diagnosis not present

## 2019-11-15 NOTE — Progress Notes (Signed)
   Subjective:    Patient ID: Kristen Drake, female    DOB: [birth year], 64 y.o.   MRN: [id number]  HPI   Review of Systems     Objective:   Physical Exam        Assessment & Plan:

## 2019-11-15 NOTE — Progress Notes (Signed)
   Subjective:    Subjective   Patient ID: Kristen Drake, female    DOB: 1956-01-15, 65 y.o.   MRN: [id number]  HPI Pt here for follow up of hypertension and medication. Pt states dizziness has resolved. Pt reports her average B/P via home manual collection is 116/77 for this month. She reports starting [employer] diet and educational program 08/2019. She exercises 1/hr daily on her stationary bike and stays very active in her yard. She reports an intentional weight loss of 20lb since August with a goal of 70lbs total. She requests a flu vaccine at today's visit.   Review of Systems  Constitutional: Positive for weight loss. Negative for chills, fever and malaise/fatigue.  HENT: Negative for congestion, sinus pain and tinnitus.   Eyes: Negative for blurred vision and pain.  Respiratory: Negative for cough, shortness of breath and wheezing.   Cardiovascular: Positive for leg swelling. Negative for chest pain, palpitations and orthopnea.  Gastrointestinal: Negative for abdominal pain, constipation, diarrhea, nausea and vomiting.  Genitourinary: Negative for frequency and urgency.  Neurological: Negative for dizziness, weakness and headaches.  Lower leg edema has improved with HCTZ and weight loss.       Objective:   Objective  BP 138/88   Pulse 97   Temp (!) 96.3 F (35.7 C)   Wt 226 lb 3.2 oz (102.6 kg)   LMP 06/28/2010   SpO2 97%   BMI 38.83 kg/m    Physical Exam  Physical Exam Constitutional:      General: She is not in acute distress.    Appearance: Normal appearance. She is not ill-appearing.  Cardiovascular:     Rate and Rhythm: Normal rate and regular rhythm.     Pulses: Normal pulses.     Heart sounds: No murmur heard.   Pulmonary:     Effort: Pulmonary effort is normal. No respiratory distress.     Breath sounds: No wheezing.  Musculoskeletal:        General: No tenderness.     Right lower leg: Edema present.     Left lower leg: Edema present.      Comments: 1+ pitting edema to right and left lower legs  Skin:    General: Skin is warm and dry.     Capillary Refill: Capillary refill takes 2 to 3 seconds.  Neurological:     Mental Status: She is alert and oriented to person, place, and time.        Assessment & Plan:   Problem List Items Addressed This Visit      Cardiovascular and Mediastinum   Essential hypertension     Other   Morbid obesity ([place])    Other Visit Diagnoses    Need for vaccination    -  Primary   Relevant Orders   Flu Vaccine QUAD 6+ mos PF IM (Fluarix Quad PF) (Completed)       Continue current medication regimen and home BP monitoring.   Continue regular activity and a weight loss goals. Encouraged Shingrix vaccine at local pharmacy. Strongly recommend wellness exam with labs as soon as possible Call back sooner if any problems. Return in about 2 months (around 01/15/2020) for Physical .

## 2019-11-16 ENCOUNTER — Encounter: Payer: Self-pay | Admitting: Nurse Practitioner

## 2019-12-21 IMAGING — MG 2D DIGITAL SCREENING BILATERAL MAMMOGRAM WITH 3D TOMO WITH CAD
8 of 12 series · 8 of 28 positions shown · non-contrast
Comparison: Previous exam(s).

CLINICAL DATA: Screening.

EXAM:
2D DIGITAL SCREENING BILATERAL MAMMOGRAM WITH 3D TOMO WITH CAD

[L MLO synth-2D]
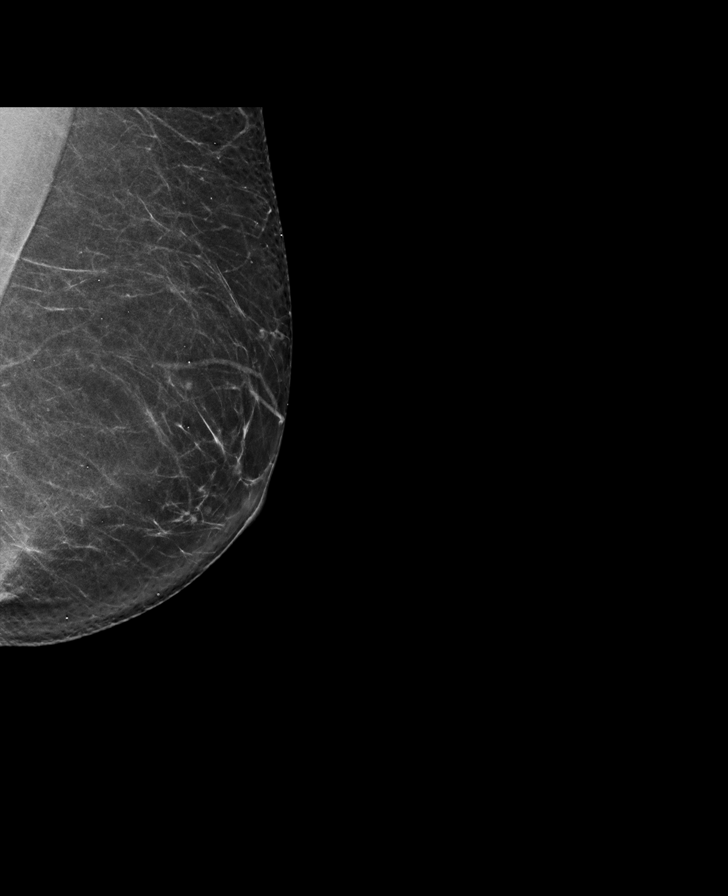

[L MLO]
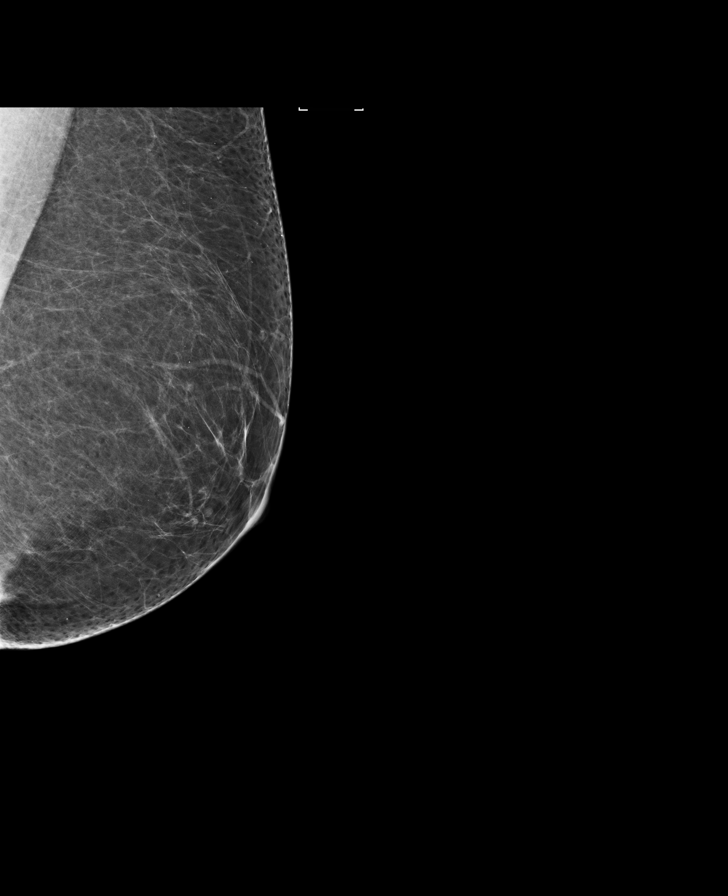

[R CC]
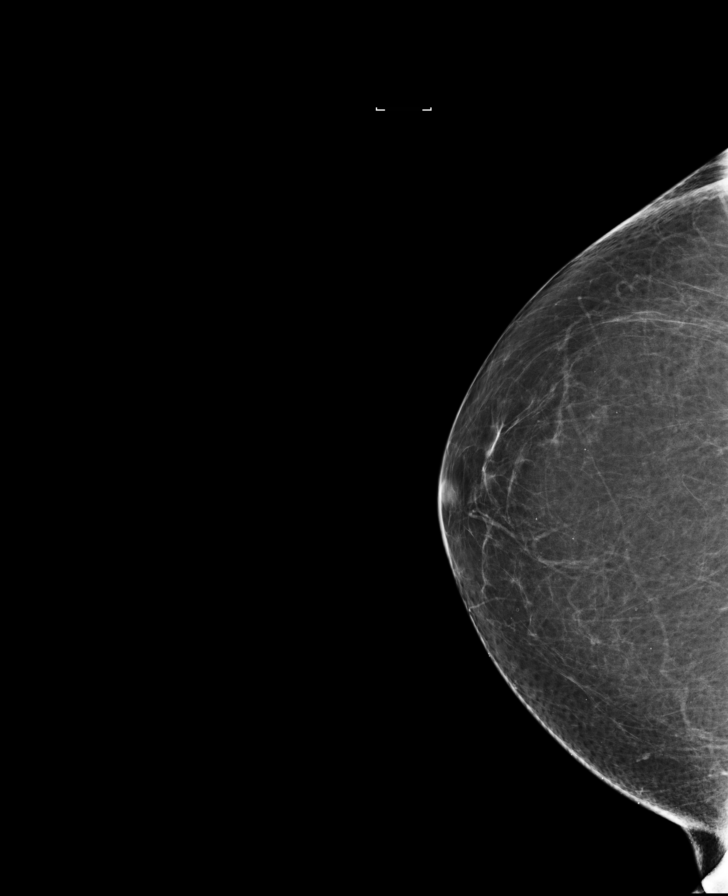

[R MLO synth-2D]
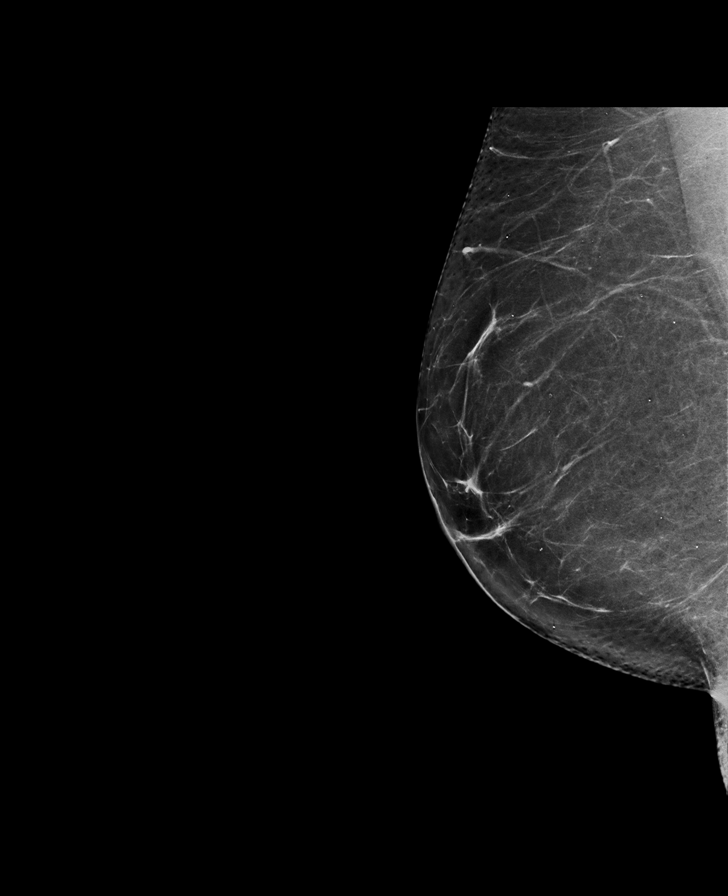

[L CC]
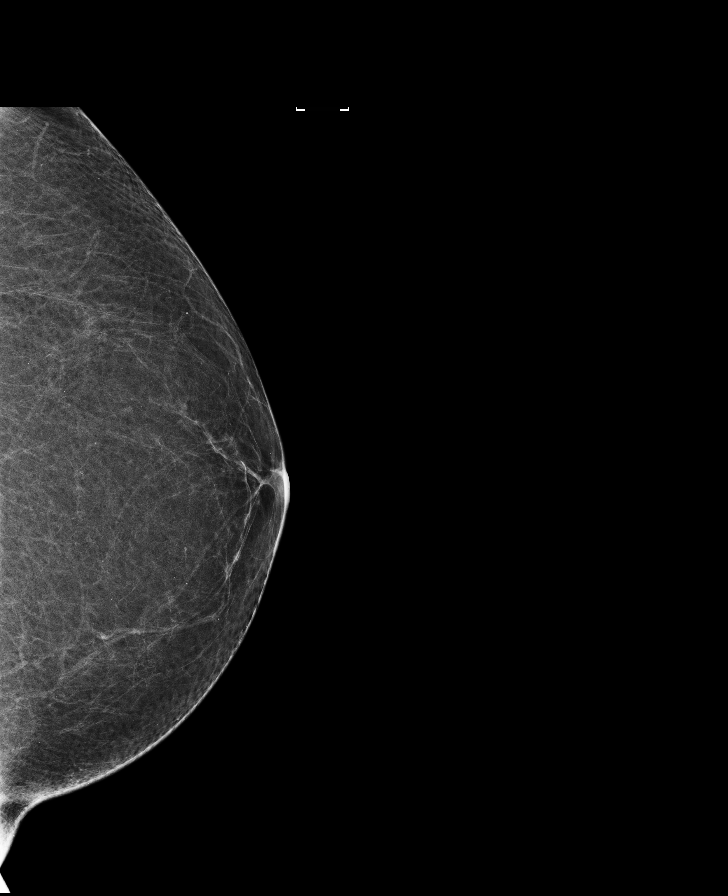

[R CC synth-2D]
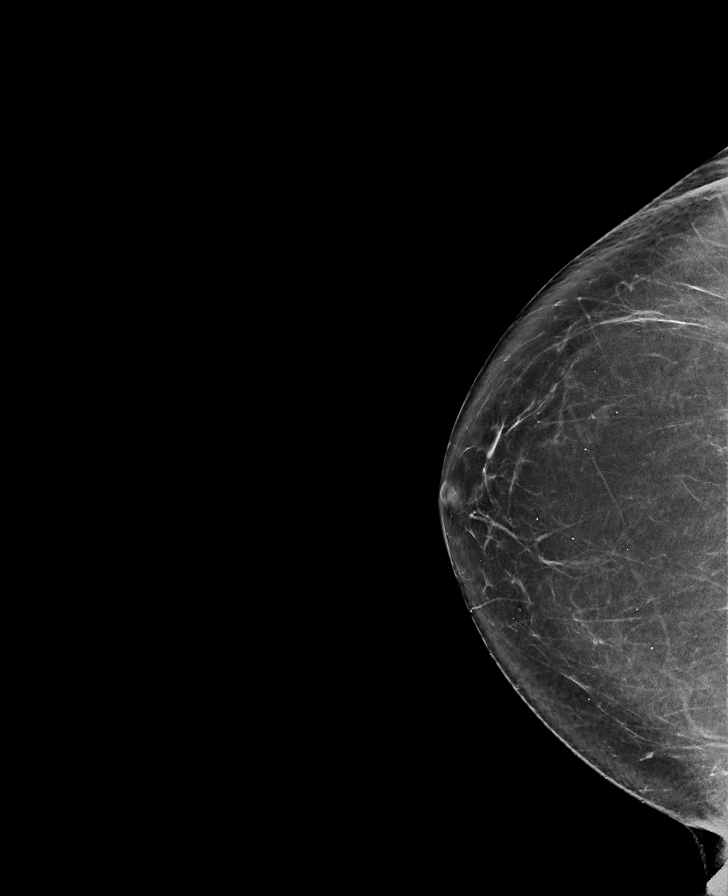

[R MLO]
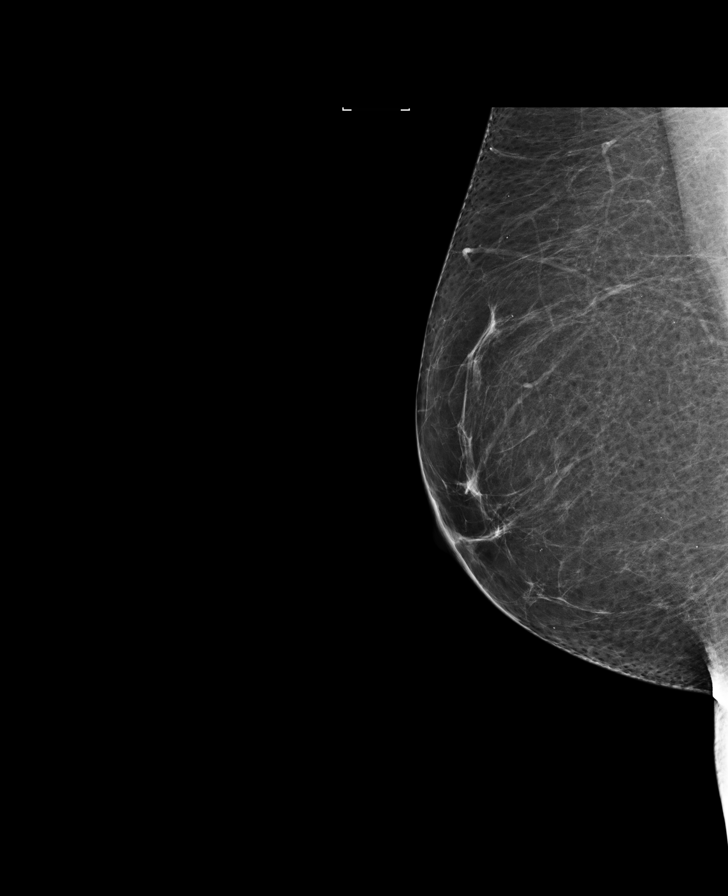

[L CC synth-2D]
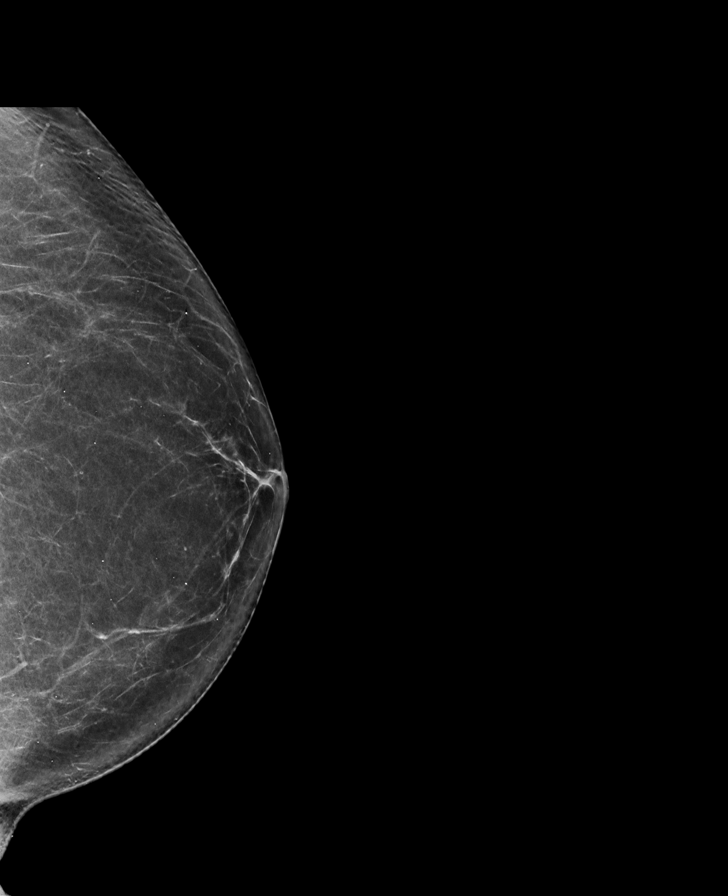

[8 of 28 positions shown; findings below may reference images not displayed]

ACR Breast Density Category b: There are scattered areas of
fibroglandular density.
FINDINGS: There are no findings suspicious for malignancy. Images were
processed with CAD.
IMPRESSION: No mammographic evidence of malignancy. A result letter of this
screening mammogram will be mailed directly to the patient.

RECOMMENDATION:
Screening mammogram in one year. (Code:[id number]-P-ZS0)

BI-RADS CATEGORY  1: Negative.

## 2020-01-27 ENCOUNTER — Other Ambulatory Visit: Payer: Self-pay | Admitting: Nurse Practitioner

## 2020-03-23 ENCOUNTER — Other Ambulatory Visit: Payer: Self-pay | Admitting: Obstetrics and Gynecology

## 2020-03-23 DIAGNOSIS — Z0000 Encounter for general adult medical examination without abnormal findings: Secondary | ICD-10-CM

## 2020-04-03 ENCOUNTER — Other Ambulatory Visit: Payer: Self-pay | Admitting: Nurse Practitioner

## 2020-05-08 ENCOUNTER — Other Ambulatory Visit: Payer: Self-pay | Admitting: Nurse Practitioner

## 2020-05-13 ENCOUNTER — Other Ambulatory Visit: Payer: Self-pay

## 2020-05-13 ENCOUNTER — Ambulatory Visit
Admission: RE | Admit: 2020-05-13 | Discharge: 2020-05-13 | Disposition: A | Discharge status: Home / Self Care | Payer: 59 | Source: Ambulatory Visit | Attending: Obstetrics and Gynecology | Admitting: Obstetrics and Gynecology

## 2020-05-13 DIAGNOSIS — Z0000 Encounter for general adult medical examination without abnormal findings: Secondary | ICD-10-CM

## 2020-06-25 ENCOUNTER — Ambulatory Visit: Payer: 59 | Admitting: Podiatry

## 2020-06-25 ENCOUNTER — Other Ambulatory Visit: Payer: Self-pay

## 2020-06-25 ENCOUNTER — Ambulatory Visit (INDEPENDENT_AMBULATORY_CARE_PROVIDER_SITE_OTHER): Payer: 59

## 2020-06-25 ENCOUNTER — Encounter: Payer: Self-pay | Admitting: Podiatry

## 2020-06-25 DIAGNOSIS — M21619 Bunion of unspecified foot: Secondary | ICD-10-CM

## 2020-06-25 DIAGNOSIS — M2041 Other hammer toe(s) (acquired), right foot: Secondary | ICD-10-CM

## 2020-06-25 DIAGNOSIS — M216X9 Other acquired deformities of unspecified foot: Secondary | ICD-10-CM

## 2020-06-25 DIAGNOSIS — M2042 Other hammer toe(s) (acquired), left foot: Secondary | ICD-10-CM

## 2020-06-25 DIAGNOSIS — M21621 Bunionette of right foot: Secondary | ICD-10-CM | POA: Diagnosis not present

## 2020-06-25 DIAGNOSIS — M21622 Bunionette of left foot: Secondary | ICD-10-CM

## 2020-06-25 NOTE — Progress Notes (Signed)
Subjective:   Patient ID: Kristen Drake, female   DOB: 65 y.o.   MRN: [id number]   HPI Patient presents stating she has had chronic pain in her right over her left foot and its been gradually getting worse and she states the bunion bothers her the toes bother her in the bone underneath bothers her.  States she is tried wider shoes she is tried trimming she tried padding and soaks without relief of symptoms.  Patient does not smoke likes to be active   Review of Systems  All other systems reviewed and are negative.       Objective:  Physical Exam Vitals and nursing note reviewed.  Constitutional:      Appearance: She is well-developed.  Pulmonary:     Effort: Pulmonary effort is normal.  Musculoskeletal:        General: Normal range of motion.  Skin:    General: Skin is warm.  Neurological:     Mental Status: She is alert.     Neurovascular status intact muscle strength adequate range of motion within normal limits with patient found to have hyperostosis medial aspect first metatarsal head bilateral significant deviation with elongation digits 234 right foot and the left foot and abnormal third metatarsal right over left foot and pressure underneath the fifth metatarsal head bilateral with keratotic lesion formation.  Patient is found to have good digital perfusion well oriented x3     Assessment:  Significant foot structural issues bilateral with bunion deformity hammertoe deformity plantarflexed metatarsal with transverse and sagittal plane deformity     Plan:  H&P reviewed all conditions.  I do think digital stabilization fusion along with metatarsal osteotomy to try to realign the foot is her best option even though there is no guarantee transverse plane I will be able to straighten the toes.  Patient wants surgery and I spent a great deal of time going over with her the surgery complications alternative treatments and at this time after extensive review she signed consent form.   She understands total recovery can take 6 months to 1 year and I did go ahead today and I dispensed air fracture walker with all instructions on usage.  She is scheduled for the next several weeks is encouraged to call questions concerns which may arise  X-rays indicate that there is significant transverse sagittal plane malalignment there is elevation of the 1 to intermetatarsal angle of approximate 60 degrees bilaterally elevation of the 4 5 intermetatarsal angle bilateral with sagittal plane dislocation digit to right and chronic distal abnormal position of third metatarsal right over left

## 2020-07-02 ENCOUNTER — Telehealth: Payer: Self-pay | Admitting: Urology

## 2020-07-02 NOTE — Telephone Encounter (Signed)
DOS - 07/14/20  [place] BUNIONECTOMY RIGHT --- 02890 METATARSAL OSTEOTOMY 2,5 RIGHT ---22840 HAMMERTOE REPAIR 2-4 RIGHT --- [id number]   [facility] EFFECTIVE DATE - 01/25/20   PLAN DEDUCTIBLE - $0.00  OUT OF POCKET - $500.00 W/ $466.00 REMAINING COINSURANCE - 0%  COPAY - $125.00   SPOKE WITH CHRISTY WITH [employer] AND SHE STATED THAT CPT CODE 48307 NO PRIOR AUTH IS REQUIRED. CPT CODES 35430 AND 14840 DID Kristen Drake # [id number], GOOD FROM 07/14/20 - 10/12/20.  REF # [id number]

## 2020-07-06 ENCOUNTER — Other Ambulatory Visit: Payer: Self-pay | Admitting: Family Medicine

## 2020-07-06 NOTE — Telephone Encounter (Signed)
Please contact patient to have her set up appt then may route back to nurses. Thank you!

## 2020-07-06 NOTE — Telephone Encounter (Signed)
Sent my chart message to schedule appt

## 2020-07-07 NOTE — Telephone Encounter (Signed)
Patient made appointment on 6/30 for medication followup

## 2020-07-09 HISTORY — PX: ORTHOPEDIC SURGERY: SHX850

## 2020-07-13 MED ORDER — OXYCODONE-ACETAMINOPHEN 10-325 MG PO TABS: 1.0000 | ORAL_TABLET | ORAL | 0 refills | Status: DC | PRN

## 2020-07-13 MED ORDER — ONDANSETRON HCL 4 MG PO TABS: 4.0000 mg | ORAL_TABLET | Freq: Three times a day (TID) | ORAL | 0 refills | Status: DC | PRN

## 2020-07-13 NOTE — Addendum Note (Signed)
Addended by: Wallene Huh on: 07/13/2020 02:35 PM   Modules accepted: Orders

## 2020-07-14 DIAGNOSIS — M2011 Hallux valgus (acquired), right foot: Secondary | ICD-10-CM

## 2020-07-14 DIAGNOSIS — M21541 Acquired clubfoot, right foot: Secondary | ICD-10-CM

## 2020-07-14 DIAGNOSIS — D492 Neoplasm of unspecified behavior of bone, soft tissue, and skin: Secondary | ICD-10-CM

## 2020-07-14 DIAGNOSIS — M2041 Other hammer toe(s) (acquired), right foot: Secondary | ICD-10-CM

## 2020-07-20 ENCOUNTER — Ambulatory Visit (INDEPENDENT_AMBULATORY_CARE_PROVIDER_SITE_OTHER): Payer: 59 | Admitting: Podiatry

## 2020-07-20 ENCOUNTER — Encounter: Payer: Self-pay | Admitting: Podiatry

## 2020-07-20 ENCOUNTER — Ambulatory Visit (INDEPENDENT_AMBULATORY_CARE_PROVIDER_SITE_OTHER): Payer: 59

## 2020-07-20 ENCOUNTER — Other Ambulatory Visit: Payer: Self-pay

## 2020-07-20 DIAGNOSIS — Z9889 Other specified postprocedural states: Secondary | ICD-10-CM | POA: Diagnosis not present

## 2020-07-20 NOTE — Progress Notes (Signed)
Subjective:   Patient ID: Kristen Drake, female   DOB: 65 y.o.   MRN: [id number]   HPI Patient states doing very well with surgery with minimal discomfort states she is walking on her boot and is not taking pain medication   ROS      Objective:  Physical Exam  Neurovascular status intact negative Bevelyn Buckles' sign noted wound edges well coapted everything in good alignment pin intact digits 234     Assessment:  Doing well post forefoot reconstruction right foot     Plan:  H&P x-ray reviewed recommended the continuation of elevation compression immobilization reapplied sterile dressing and reappoint 2 weeks for stitch removal  X-rays indicate osteotomies are healing well fixation in place alignment good

## 2020-07-23 ENCOUNTER — Encounter: Payer: Self-pay | Admitting: Nurse Practitioner

## 2020-07-23 ENCOUNTER — Other Ambulatory Visit: Payer: Self-pay

## 2020-07-23 ENCOUNTER — Ambulatory Visit: Payer: 59 | Admitting: Nurse Practitioner

## 2020-07-23 VITALS — BP 120/82 | HR 64 | Temp 97.3°F | Ht 64.0 in | Wt 253.0 lb

## 2020-07-23 DIAGNOSIS — E039 Hypothyroidism, unspecified: Secondary | ICD-10-CM

## 2020-07-23 DIAGNOSIS — I10 Essential (primary) hypertension: Secondary | ICD-10-CM | POA: Diagnosis not present

## 2020-07-23 NOTE — Progress Notes (Signed)
b  Subjective:    Patient ID: Kristen Drake, female    DOB: 1955/10/11, 65 y.o.   MRN: [id number]  Hypertension This is a chronic problem. Treatments tried: lisinopril, hctz.   Hypothyroidism- requests labs   Recent bunion and hammer toe repair Presents for recheck on her hypertension and hypothyroidism.  Adherent to medication regimen.  BP outside the office at home 120/82 and recently before her surgery 114/77. Gets regular preventive health physicals.  Is due for her routine lab work. Limited activity at this point due to her recent surgery.  Plans to slowly restart her activity when approved.  Has been trying to eat as healthy as possible. No chest pain/ischemic type pain or shortness of breath. No palpitations or tremors. Depression screen [facility] 2/9 07/23/2020  Decreased Interest 0  Down, Depressed, Hopeless 0  PHQ - 2 Score 0  Altered sleeping -  Tired, decreased energy -  Change in appetite -  Feeling bad or failure about yourself  -  Trouble concentrating -  Moving slowly or fidgety/restless -  Suicidal thoughts -  PHQ-9 Score -  Difficult doing work/chores -          Objective:   Physical Exam NAD.  Alert, oriented.  Thyroid nontender to palpation, no mass or goiter noted.  Lungs clear.  Heart regular rate rhythm.  Wearing a boot on her right foot. Today's Vitals   07/23/20 1500  BP: 120/82  Pulse: 64  Temp: (!) 97.3 F (36.3 C)  SpO2: 99%  Weight: 253 lb (114.8 kg)  Height: 5\' 4"  (1.626 m)   Body mass index is 43.43 kg/m.      Assessment & Plan:   Problem List Items Addressed This Visit       Cardiovascular and Mediastinum   Essential hypertension - Primary   Relevant Orders   CBC with Differential/Platelet   Lipid panel   TSH   Comprehensive metabolic panel     Endocrine   Hypothyroidism   Relevant Orders   CBC with Differential/Platelet   Lipid panel   TSH   Comprehensive metabolic panel   Routine labs pending. Recommend patient  restart activity per her specialist.  In the meantime try to eat as healthy as possible. Return in about 6 months (around 01/22/2021).

## 2020-07-25 ENCOUNTER — Encounter: Payer: Self-pay | Admitting: Nurse Practitioner

## 2020-07-28 ENCOUNTER — Other Ambulatory Visit: Payer: Self-pay

## 2020-07-28 ENCOUNTER — Ambulatory Visit
Admission: RE | Admit: 2020-07-28 | Discharge: 2020-07-28 | Disposition: A | Discharge status: Home / Self Care | Payer: 59 | Source: Ambulatory Visit | Attending: Family Medicine | Admitting: Family Medicine

## 2020-07-28 VITALS — BP 150/87 | HR 80 | Temp 100.0°F | Resp 16

## 2020-07-28 DIAGNOSIS — Z20822 Contact with and (suspected) exposure to covid-19: Secondary | ICD-10-CM

## 2020-07-28 DIAGNOSIS — B349 Viral infection, unspecified: Secondary | ICD-10-CM

## 2020-07-28 MED ORDER — ALBUTEROL SULFATE HFA 108 (90 BASE) MCG/ACT IN AERS: 2.0000 | INHALATION_SPRAY | Freq: Once | RESPIRATORY_TRACT | Status: DC

## 2020-07-28 MED ORDER — PREDNISONE 20 MG PO TABS: 40.0000 mg | ORAL_TABLET | Freq: Every day | ORAL | 0 refills | Status: DC

## 2020-07-28 MED ORDER — PROMETHAZINE-DM 6.25-15 MG/5ML PO SYRP: 5.0000 mL | ORAL_SOLUTION | Freq: Four times a day (QID) | ORAL | 0 refills | Status: DC | PRN

## 2020-07-28 MED ORDER — NIRMATRELVIR/RITONAVIR (PAXLOVID)TABLET: 3.0000 | ORAL_TABLET | Freq: Two times a day (BID) | ORAL | 0 refills | Status: DC

## 2020-07-28 NOTE — ED Triage Notes (Signed)
Pt presents with fever, headache and body aches that began yesterday, husband tested positive for covid

## 2020-07-28 NOTE — ED Provider Notes (Signed)
[facility]    CSN: [id number] Arrival date & time: 07/28/20  1747      History   Chief Complaint Chief Complaint  Patient presents with   Fever   Cough   Headache    HPI Kristen Drake is a 65 y.o. female.   HPI  Patient presents today concern for possible COVID-19 infection.  Her husband tested +2 days ago.  She developed symptoms of headache yesterday however fever, cough and generalized body aches developed today.  Patient has a distant history of asthma and on arrival today her oxygen level is 93%.  She also just recently underwent foot surgery and reports she has had to use her albuterol inhaler following that surgery to help with work of breathing.  She at her baseline typically does not have any issues with asthma or chronic cough. However at present she denies any acute breathing distress, wheezing or breathlessness.  High risk conditions associated with COVID-19 infection include hypertension, morbid obesity, and asthma.  History reviewed. No pertinent past medical history.  Patient Active Problem List   Diagnosis Date Noted   Essential hypertension 08/15/2019   Hypothyroidism 03/07/2013   Morbid obesity ([place]) 03/07/2013    Past Surgical History:  Procedure Laterality Date   ORTHOPEDIC SURGERY Right 07/09/2020   R foot bunion, hammer toe repair    OB History   No obstetric history on file.      Home Medications    Prior to Admission medications   Medication Sig Start Date End Date Taking? Authorizing Provider  aspirin 81 MG tablet Take 81 mg by mouth daily.    [provider]  hydrochlorothiazide (HYDRODIURIL) 25 MG tablet TAKE ONE (1) TABLET BY MOUTH EVERY DAY AS NEEDED FOR SWELLING 05/11/20   Kathyrn Drown, MD  levothyroxine (SYNTHROID) 75 MCG tablet TAKE ONE (1) TABLET BY MOUTH EVERY DAY 11/15/19   Nilda Simmer, NP  lisinopril (ZESTRIL) 5 MG tablet TAKE ONE TABLET (5MG  TOTAL) BY MOUTH DAILY FOR BLOOD PRESSURE 07/07/20    Kathyrn Drown, MD  naproxen sodium (ANAPROX) 220 MG tablet Take 220 mg by mouth 2 (two) times daily with a meal.    [provider]  nystatin-triamcinolone ointment (MYCOLOG) Apply 1 application topically 2 (two) times daily.    [provider]  OVER THE COUNTER MEDICATION Vitamin D one daily    [provider]  OVER THE COUNTER MEDICATION Calcium 600 mg 2 a day Hair, skin, nails vit 3 a day Iron 325mg  one a day Vit b complex one a day    [provider]  oxyCODONE-acetaminophen (PERCOCET) 10-325 MG tablet Take 1 tablet by mouth every 4 (four) hours as needed for pain. 07/13/20   Wallene Huh, DPM  FEMHRT LOW DOSE 0.5-2.5 MG-MCG per tablet  03/01/13 09/19/18  [provider]    Family History Family History  Problem Relation Age of Onset   Breast cancer Neg Hx     Social History Social History   Tobacco Use   Smoking status: Never   Smokeless tobacco: Never  Substance Use Topics   Alcohol use: Not Currently   Drug use: Never     Allergies   Patient has no known allergies.   Review of Systems Review of Systems Pertinent negatives listed in HPI   Physical Exam Triage Vital Signs ED Triage Vitals [07/28/20 1754]  Enc Vitals Group     BP (!) 150/87     Pulse Rate 80  Resp 16     Temp 100 F (37.8 C)     Temp Source Tympanic     SpO2 93 %     Weight      Height      Head Circumference      Peak Flow      Pain Score      Pain Loc      Pain Edu?      Excl. in [place]?    No data found.  Updated Vital Signs BP (!) 150/87 (BP Location: Right Arm)   Pulse 80   Temp 100 F (37.8 C) (Tympanic)   Resp 16   LMP 06/28/2010   SpO2 93%   Visual Acuity Right Eye Distance:   Left Eye Distance:   Bilateral Distance:    Right Eye Near:   Left Eye Near:    Bilateral Near:     Physical Exam  General Appearance:    Alert, acutely ill appearing cooperative, no distress  HENT:   Normocephalic, ears normal, nares  mucosal edema with congestion, rhinorrhea, oropharynx  normal   Eyes:    PERRL, conjunctiva/corneas clear, EOM's intact       Lungs:     Clear to auscultation bilaterally, respirations unlabored  Heart:    Regular rate and rhythm  Neurologic:   Awake, alert, oriented x 3. No apparent focal neurological  defect.      UC Treatments / Results  Labs (all labs ordered are listed, but only abnormal results are displayed) Labs Reviewed  NOVEL CORONAVIRUS, NAA    EKG   Radiology No results found.  Procedures Procedures (including critical care time)  Medications Ordered in UC Medications - No data to display  Initial Impression / Assessment and Plan / UC Course  I have reviewed the triage vital signs and the nursing notes.  Pertinent labs & imaging results that were available during my care of the patient were reviewed by me and considered in my medical decision making (see chart for details).    Patient with close exposure to COVID-19 virus and presented today with symptoms consistent with that of COVID-19.  COVID test is pending.  Upon receipt of a positive COVID test patient will be started on Paxlovid antiviral therapy given high risk conditions of obesity, hypertension, history of asthma. Prescription has been printed and left at the nurses station pending receipt of a positive COVID test.  Patient has been educated on the benefits of starting antiviral therapy to prevent severity of symptoms associated with COVID-19.  Patient also provided an albuterol inhaler given her history of asthma and oxygen level is below baseline although lung exam today is unremarkable.  Promethazine DM for management of cough along with prednisone to relieve any underlying inflammation of the lungs.  Strict ER precautions if any worsening of breathing or chest pain develop.  Patient verbalized understanding agreement with plan. Final Clinical Impressions(s) / UC Diagnoses   Final diagnoses:  Close  exposure to COVID-19 virus  Suspected COVID-19 virus infection  Viral illness     Discharge Instructions      Your COVID 19 results should result within 3-5 days.  If your test is positive, you are eligible for Paxlovid the antiviral for COVID which reduced the severity of illness. Negative results are immediately resulted to Mychart. Positive results will receive a follow-up call from our clinic. If symptoms are present, I recommend home quarantine until results are known. Alternate Tylenol and ibuprofen as needed for  body aches and fever.  Symptom management per recommendations discussed today.  If any breathing difficulty or chest pain develops go immediately to the closest emergency department for evaluation.       ED Prescriptions     Medication Sig Dispense Auth. Provider   promethazine-dextromethorphan (PROMETHAZINE-DM) 6.25-15 MG/5ML syrup Take 5 mLs by mouth 4 (four) times daily as needed for cough. 180 mL Scot Jun, FNP   predniSONE (DELTASONE) 20 MG tablet Take 2 tablets (40 mg total) by mouth daily with breakfast. 10 tablet Scot Jun, FNP      PDMP not reviewed this encounter.   Scot Jun, FNP 07/28/20 2025

## 2020-07-28 NOTE — Discharge Instructions (Addendum)
Your COVID 19 results should result within 3-5 days.  If your test is positive, you are eligible for Paxlovid the antiviral for COVID which reduced the severity of illness. Negative results are immediately resulted to Mychart. Positive results will receive a follow-up call from our clinic. If symptoms are present, I recommend home quarantine until results are known. Alternate Tylenol and ibuprofen as needed for body aches and fever.  Symptom management per recommendations discussed today.  If any breathing difficulty or chest pain develops go immediately to the closest emergency department for evaluation.

## 2020-07-29 ENCOUNTER — Telehealth (HOSPITAL_COMMUNITY): Payer: Self-pay | Admitting: Emergency Medicine

## 2020-07-29 LAB — NOVEL CORONAVIRUS, NAA: SARS-CoV-2, NAA: DETECTED — AB

## 2020-07-29 LAB — SARS-COV-2, NAA 2 DAY TAT

## 2020-07-29 MED ORDER — NIRMATRELVIR/RITONAVIR (PAXLOVID)TABLET: 3.0000 | ORAL_TABLET | Freq: Two times a day (BID) | ORAL | 0 refills | Status: AC

## 2020-07-29 NOTE — Telephone Encounter (Signed)
Paxlovid for COVID positive patient, per Maudie Mercury, APP

## 2020-08-03 ENCOUNTER — Other Ambulatory Visit: Payer: Self-pay

## 2020-08-03 ENCOUNTER — Ambulatory Visit (INDEPENDENT_AMBULATORY_CARE_PROVIDER_SITE_OTHER): Payer: 59

## 2020-08-03 DIAGNOSIS — Z9889 Other specified postprocedural states: Secondary | ICD-10-CM

## 2020-08-03 NOTE — Progress Notes (Signed)
Patient in office today for post-op #2. Sutures removed from digits 2-4 and posterior lateral aspect of the right foot without complications. Patient denies nausea, vomiting, fever and chills. Patient advised to call the office with any questions, comments or concerns. Also advised to continue monitoring for signs of infection. Patient verbalized understanding.

## 2020-08-05 ENCOUNTER — Other Ambulatory Visit: Payer: Self-pay

## 2020-08-05 ENCOUNTER — Other Ambulatory Visit: Payer: Self-pay | Admitting: Family Medicine

## 2020-08-05 DIAGNOSIS — E039 Hypothyroidism, unspecified: Secondary | ICD-10-CM

## 2020-08-05 LAB — COMPREHENSIVE METABOLIC PANEL
ALT: 28 IU/L (ref 0–32)
AST: 13 IU/L (ref 0–40)
Albumin/Globulin Ratio: 1.8 (ref 1.2–2.2)
Albumin: 4.1 g/dL (ref 3.8–4.8)
Alkaline Phosphatase: 63 IU/L (ref 44–121)
BUN/Creatinine Ratio: 21 (ref 12–28)
BUN: 17 mg/dL (ref 8–27)
Bilirubin Total: <0.2 mg/dL (ref 0.0–1.2)
CO2: 26 mmol/L (ref 20–29)
Calcium: 9.2 mg/dL (ref 8.7–10.3)
Chloride: 104 mmol/L (ref 96–106)
Creatinine, Ser: 0.82 mg/dL (ref 0.57–1.00)
Globulin, Total: 2.3 g/dL (ref 1.5–4.5)
Glucose: 87 mg/dL (ref 65–99)
Potassium: 4.3 mmol/L (ref 3.5–5.2)
Sodium: 144 mmol/L (ref 134–144)
Total Protein: 6.4 g/dL (ref 6.0–8.5)
eGFR: 80 mL/min/{1.73_m2} (ref >59)

## 2020-08-05 LAB — CBC WITH DIFFERENTIAL/PLATELET
Basophils Absolute: 0 10*3/uL (ref 0.0–0.2)
Basos: 0 %
EOS (ABSOLUTE): 0.3 10*3/uL (ref 0.0–0.4)
Eos: 5 %
Hematocrit: 36.1 % (ref 34.0–46.6)
Hemoglobin: 12 g/dL (ref 11.1–15.9)
Immature Grans (Abs): 0 10*3/uL (ref 0.0–0.1)
Immature Granulocytes: 1 %
Lymphocytes Absolute: 2.3 10*3/uL (ref 0.7–3.1)
Lymphs: 34 %
MCH: 29.9 pg (ref 26.6–33.0)
MCHC: 33.2 g/dL (ref 31.5–35.7)
MCV: 90 fL (ref 79–97)
Monocytes Absolute: 0.5 10*3/uL (ref 0.1–0.9)
Monocytes: 8 %
Neutrophils Absolute: 3.6 10*3/uL (ref 1.4–7.0)
Neutrophils: 52 %
Platelets: 258 10*3/uL (ref 150–450)
RBC: 4.02 x10E6/uL (ref 3.77–5.28)
RDW: 12.5 % (ref 11.7–15.4)
WBC: 6.8 10*3/uL (ref 3.4–10.8)

## 2020-08-05 LAB — LIPID PANEL
Chol/HDL Ratio: 3.3 ratio (ref 0.0–4.4)
Cholesterol, Total: 156 mg/dL (ref 100–199)
HDL: 48 mg/dL (ref >39)
LDL Chol Calc (NIH): 81 mg/dL (ref 0–99)
Triglycerides: 159 mg/dL — ABNORMAL HIGH (ref 0–149)
VLDL Cholesterol Cal: 27 mg/dL (ref 5–40)

## 2020-08-05 LAB — TSH: TSH: 5.94 u[IU]/mL — ABNORMAL HIGH (ref 0.450–4.500)

## 2020-08-05 MED ORDER — LEVOTHYROXINE SODIUM 88 MCG PO TABS: ORAL_TABLET | ORAL | 1 refills | Status: DC

## 2020-08-17 ENCOUNTER — Encounter: Payer: Self-pay | Admitting: Podiatry

## 2020-08-17 ENCOUNTER — Other Ambulatory Visit: Payer: Self-pay

## 2020-08-17 ENCOUNTER — Ambulatory Visit (INDEPENDENT_AMBULATORY_CARE_PROVIDER_SITE_OTHER): Payer: 59 | Admitting: Podiatry

## 2020-08-17 ENCOUNTER — Ambulatory Visit (INDEPENDENT_AMBULATORY_CARE_PROVIDER_SITE_OTHER): Payer: 59

## 2020-08-17 DIAGNOSIS — Z9889 Other specified postprocedural states: Secondary | ICD-10-CM | POA: Diagnosis not present

## 2020-08-17 NOTE — Progress Notes (Signed)
Subjective:   Patient ID: Kristen Drake, female   DOB: 65 y.o.   MRN: [id number]   HPI Patient states doing great wants to get her second foot fixed in September or October when she returns from vacation   ROS      Objective:  Physical Exam  Neurovascular status intact negative Bevelyn Buckles' sign noted right foot healing well wound edges well coapted pins intact second third fourth digit good alignment noted good range of motion F2     Assessment:  Doing well postoperatively by right foot surgery ready for left foot surgery to be done     Plan:  H&P reviewed condition removed pin and sterile dressings applied x-rays reviewed advised on continued elevation compression but gradual return to soft shoe gear and dispensed ankle compression stocking.  Reappoint in approximately 4 weeks to discuss second foot and final visit of right foot  X-ray indicates osteotomy healing very well digits in good alignment screws in place with no indication secondary motion

## 2020-09-09 ENCOUNTER — Other Ambulatory Visit: Payer: Self-pay | Admitting: Family Medicine

## 2020-09-17 ENCOUNTER — Other Ambulatory Visit: Payer: Self-pay

## 2020-09-17 ENCOUNTER — Ambulatory Visit (INDEPENDENT_AMBULATORY_CARE_PROVIDER_SITE_OTHER): Payer: 59

## 2020-09-17 ENCOUNTER — Ambulatory Visit: Payer: 59 | Admitting: Podiatry

## 2020-09-17 DIAGNOSIS — Z9889 Other specified postprocedural states: Secondary | ICD-10-CM

## 2020-09-17 NOTE — Progress Notes (Signed)
Subjective:   Patient ID: Kristen Drake, female   DOB: 65 y.o.   MRN: [id number]   HPI Patient presents stating doing well with her right foot and will probably hold off till December for her left foot.  States that overall she is doing very well and walking with a good heel toe gait pattern neuro   ROS      Objective:  Physical Exam  Vascular status intact negative Bevelyn Buckles' sign noted wound edges are well coapted hallux in rectus position first MPJ motion good digits in good alignment with good healing and only mild swelling.  Left foot shows structural bunion digital deformities and metatarsal deformity     Assessment:  Doing well post forefoot reconstruction right with structural HAV digital deformities to 3 left and tailor's bunion deformity left     Plan:  H&P all conditions reviewed right and advised on gradual return to normal activity and shoe gear with continued compression elevation as needed.  For the left I did discuss distal osteotomy metatarsal osteotomy fifth left along with digital fusion digits 2 3 and that will be done hopefully in December and she is get up also evaluate her knee and decide what needs to be done first  X-rays right indicated that the osteotomy is healing well good alignment noted good reduction of deformity

## 2020-10-05 ENCOUNTER — Other Ambulatory Visit: Payer: Self-pay | Admitting: Family Medicine

## 2020-10-23 ENCOUNTER — Telehealth: Payer: Self-pay | Admitting: Family Medicine

## 2020-10-23 NOTE — Telephone Encounter (Signed)
Patient needs Pre-Operative Risk Assessment form completed for knee surgery. Form in box at nurses station please review.  CB#  [phone]

## 2020-10-23 NOTE — Telephone Encounter (Signed)
Please contact patient to have her schedule appt for pre op. Thank you!

## 2020-10-26 NOTE — Telephone Encounter (Signed)
Patient is scheduled for pre op appt. 11/7 at 9:40

## 2020-10-31 ENCOUNTER — Ambulatory Visit
Admission: RE | Admit: 2020-10-31 | Discharge: 2020-10-31 | Disposition: A | Discharge status: Home / Self Care | Payer: 59 | Source: Ambulatory Visit | Attending: Family Medicine | Admitting: Family Medicine

## 2020-10-31 ENCOUNTER — Other Ambulatory Visit: Payer: Self-pay

## 2020-10-31 VITALS — BP 137/84 | HR 71 | Temp 98.0°F | Resp 16

## 2020-10-31 DIAGNOSIS — R109 Unspecified abdominal pain: Secondary | ICD-10-CM

## 2020-10-31 DIAGNOSIS — R3129 Other microscopic hematuria: Secondary | ICD-10-CM

## 2020-10-31 HISTORY — DX: Essential (primary) hypertension: I10

## 2020-10-31 HISTORY — DX: Hypothyroidism, unspecified: E03.9

## 2020-10-31 LAB — POCT URINALYSIS DIP (MANUAL ENTRY)
Bilirubin, UA: NEGATIVE
Glucose, UA: NEGATIVE mg/dL
Ketones, POC UA: NEGATIVE mg/dL
Leukocytes, UA: NEGATIVE
Nitrite, UA: NEGATIVE
Protein Ur, POC: NEGATIVE mg/dL
Spec Grav, UA: <=1.005 — AB (ref 1.010–1.025)
Urobilinogen, UA: 0.2 E.U./dL
pH, UA: 5.5 (ref 5.0–8.0)

## 2020-10-31 NOTE — ED Triage Notes (Signed)
Patient c/o LFT sided flank pain that started yesterday.   Patient endorses " the last few days it's been hard to eliminate out, bit no burning or anything".   Patient denies fever or N/V.   Patient endorses LFT sided lower ABD pain that radiates from flank area.   Patient hasn't taken any medications for symptoms.

## 2020-10-31 NOTE — Discharge Instructions (Signed)
Please do your best to ensure adequate fluid intake in order to avoid dehydration. If you find that you are unable to tolerate drinking fluids regularly please proceed to the Emergency Department for evaluation. ° ° °

## 2020-11-02 NOTE — ED Provider Notes (Signed)
  [place]    ASSESSMENT & PLAN:  1. Left flank pain   2. Other microscopic hematuria    Possibility of kidney stone discussed. She is comfortable with home observation. Agrees to ED evaluation should her symptoms worsen.  Labs Reviewed  POCT URINALYSIS DIP (MANUAL ENTRY) - Abnormal; Notable for the following components:      Result Value   Spec Grav, UA <=1.005 (*)    Blood, UA small (*)    All other components within normal limits   No signs of pyelonephritis. Discussed.   Follow-up Information     [facility] EMERGENCY DEPARTMENT.   Specialty: Emergency Medicine Why: If symptoms worsen in any way. Contact information: [address] [id number] [place] [phone]                Outlined signs and symptoms indicating need for more acute intervention. Patient verbalized understanding. After Visit Summary given.  SUBJECTIVE:  Kristen Drake is a 65 y.o. female who reports L flank pain/discomfort; first noted yesterday; off/on; does radiate toward groin today. Has not limited daily activities. Afebrile. Normal PO intake without n/v/d. No h/o kidney stones. No dysuria. Ambulatory without difficulty.  LMP: Patient's last menstrual period was 06/28/2010. .  OBJECTIVE:  Vitals:   10/31/20 1420  BP: 137/84  Pulse: 71  Resp: 16  Temp: 98 F (36.7 C)  TempSrc: Oral  SpO2: 95%   General appearance: alert; no distress HENT: oropharynx: moist Lungs: unlabored respirations Abdomen: soft, non-tender; bowel sounds normal; no masses or organomegaly; no guarding or rebound tenderness Back: mild L CVA tenderness Extremities: no edema; symmetrical with no gross deformities Skin: warm and dry Neurologic: normal gait Psychological: alert and cooperative; normal mood and affect  Labs Reviewed  POCT URINALYSIS DIP (MANUAL ENTRY) - Abnormal; Notable for the following components:      Result Value   Spec Grav, UA <=1.005  (*)    Blood, UA small (*)    All other components within normal limits    No Known Allergies  Past Medical History:  Diagnosis Date   Hypertension    Hypothyroid    Social History   Socioeconomic History   Marital status: Married    Spouse name: Not on file   Number of children: Not on file   Years of education: Not on file   Highest education level: Not on file  Occupational History   Not on file  Tobacco Use   Smoking status: Never   Smokeless tobacco: Never  Substance and Sexual Activity   Alcohol use: Not Currently   Drug use: Never   Sexual activity: Not Currently    Birth control/protection: Post-menopausal  Other Topics Concern   Not on file  Social History Narrative   Not on file   Social Determinants of Health   Financial Resource Strain: Not on file  Food Insecurity: Not on file  Transportation Needs: Not on file  Physical Activity: Not on file  Stress: Not on file  Social Connections: Not on file  Intimate Partner Violence: Not on file   Family History  Problem Relation Age of Onset   Breast cancer Neg Hx         Vanessa Kick, MD 11/02/20 1251

## 2020-11-30 ENCOUNTER — Ambulatory Visit: Payer: 59 | Admitting: Family Medicine

## 2020-11-30 ENCOUNTER — Other Ambulatory Visit: Payer: Self-pay

## 2020-11-30 ENCOUNTER — Encounter: Payer: Self-pay | Admitting: Family Medicine

## 2020-11-30 VITALS — BP 108/72 | HR 100 | Temp 97.7°F | Wt 239.8 lb

## 2020-11-30 DIAGNOSIS — E039 Hypothyroidism, unspecified: Secondary | ICD-10-CM | POA: Diagnosis not present

## 2020-11-30 DIAGNOSIS — Z01818 Encounter for other preprocedural examination: Secondary | ICD-10-CM

## 2020-11-30 DIAGNOSIS — I10 Essential (primary) hypertension: Secondary | ICD-10-CM | POA: Diagnosis not present

## 2020-11-30 DIAGNOSIS — Z23 Encounter for immunization: Secondary | ICD-10-CM

## 2020-11-30 NOTE — Patient Instructions (Signed)

## 2020-11-30 NOTE — Progress Notes (Signed)
   Subjective:    Patient ID: Kristen Drake, female    DOB: 07/24/1955, 65 y.o.   MRN: [id number]  HPI Pt here for surgical clearance. Pt having partial knee replacement hopefully in December.  Patient has had a previous knee surgery a couple years ago did very well on had a foot surgery earlier this year did well on Did not have any problems with anesthesia She also states she is able to walk around and go up a flight of steps without having to stop she denies any chest tightness pressure pain or shortness of breath does not have diabetes or any known heart disease She does not smoke. Review of Systems     Objective:   Physical Exam General-in no acute distress Eyes-no discharge Lungs-respiratory rate normal, CTA CV-no murmurs,RRR Extremities skin warm dry no edema Neuro grossly normal Behavior normal, alert  Patient to do follow-up lab test on TSH to make sure it is in a good zone she is taking her medicines      Assessment & Plan:  EKG looks normal Knee osteoarthritis she is a low risk candidate for partial knee replacement I did discuss with her the importance of following all orthopedic advice regarding diminishing risk of blood clots or other complications from surgery  She is approved for surgery No cardiac testing is necessary based upon her overall condition  Patient has morbid obesity we did discuss healthy diet regular activity trying to lose weight  Consideration for GLP-1 was discussed

## 2020-12-01 LAB — TSH: TSH: 1.26 u[IU]/mL (ref 0.450–4.500)

## 2021-01-04 ENCOUNTER — Other Ambulatory Visit: Payer: Self-pay | Admitting: Family Medicine

## 2021-01-08 ENCOUNTER — Other Ambulatory Visit: Payer: Self-pay | Admitting: Family Medicine

## 2021-01-22 ENCOUNTER — Ambulatory Visit: Payer: 59 | Admitting: Family Medicine

## 2021-01-26 ENCOUNTER — Other Ambulatory Visit: Payer: Self-pay | Admitting: Family Medicine

## 2021-03-11 DIAGNOSIS — H5203 Hypermetropia, bilateral: Secondary | ICD-10-CM | POA: Diagnosis not present

## 2021-04-02 ENCOUNTER — Other Ambulatory Visit: Payer: Self-pay | Admitting: Family Medicine

## 2021-04-20 ENCOUNTER — Encounter: Payer: Self-pay | Admitting: Family Medicine

## 2021-04-20 ENCOUNTER — Other Ambulatory Visit: Payer: Self-pay

## 2021-04-20 ENCOUNTER — Encounter (HOSPITAL_BASED_OUTPATIENT_CLINIC_OR_DEPARTMENT_OTHER): Payer: Self-pay

## 2021-04-20 ENCOUNTER — Ambulatory Visit (INDEPENDENT_AMBULATORY_CARE_PROVIDER_SITE_OTHER): Payer: Medicare HMO | Admitting: Family Medicine

## 2021-04-20 ENCOUNTER — Ambulatory Visit (HOSPITAL_BASED_OUTPATIENT_CLINIC_OR_DEPARTMENT_OTHER)
Admission: RE | Admit: 2021-04-20 | Discharge: 2021-04-20 | Disposition: A | Discharge status: Home / Self Care | Payer: Medicare HMO | Source: Ambulatory Visit | Attending: Family Medicine | Admitting: Family Medicine

## 2021-04-20 VITALS — BP 136/82 | HR 66 | Temp 97.8°F | Wt 226.0 lb

## 2021-04-20 DIAGNOSIS — N200 Calculus of kidney: Secondary | ICD-10-CM | POA: Diagnosis not present

## 2021-04-20 DIAGNOSIS — N281 Cyst of kidney, acquired: Secondary | ICD-10-CM | POA: Diagnosis not present

## 2021-04-20 DIAGNOSIS — R109 Unspecified abdominal pain: Secondary | ICD-10-CM | POA: Diagnosis not present

## 2021-04-20 LAB — POCT URINALYSIS DIPSTICK
Spec Grav, UA: 1.02 (ref 1.010–1.025)
pH, UA: 6 (ref 5.0–8.0)

## 2021-04-20 MED ORDER — HYDROCODONE-ACETAMINOPHEN 5-325 MG PO TABS: 1.0000 | ORAL_TABLET | Freq: Three times a day (TID) | ORAL | 0 refills | Status: DC | PRN

## 2021-04-20 MED ORDER — TAMSULOSIN HCL 0.4 MG PO CAPS: 0.4000 mg | ORAL_CAPSULE | Freq: Every day | ORAL | 0 refills | Status: DC

## 2021-04-20 NOTE — Assessment & Plan Note (Signed)
Patient with left flank pain which is now radiating to the left lower abdomen and groin.  Blood noted on urinalysis.  Suspected kidney stone.  Arranging CT scan.  Advise increase fluid intake.  Starting on Flomax.  Hydrocodone as needed for pain. ?

## 2021-04-20 NOTE — Progress Notes (Signed)
? ?Subjective:  ?Patient ID: Kristen Drake, female    DOB: 29-Jul-1955  Age: 66 y.o. MRN: [id number] ? ?CC: ?Chief Complaint  ?Patient presents with  ? Flank Pain  ?  Pt had discomfort about 1.5-2 weeks that now has turned to a pain. Radiates to front groin area. Pt did take a few left over Oxy she had from foot surgery.   ? ? ?HPI: ? ?66 year old female presents for evaluation the above. ? ?Patient reports intermittent left flank pain for the past 1.5 to 2 weeks.  Severe at times.  Now radiates to the left lower abdomen and left inguinal region.  No reports of gross hematuria.  No recent fall, trauma, injury.  She does note that she has recently been sanding and painting.  She does not feel like this is contributing.  Pain is worse with certain positions.  Pain is mild to moderate currently.  No history of nephrolithiasis.  No other associated symptoms.  No other complaints. ? ?Patient Active Problem List  ? Diagnosis Date Noted  ? Flank pain 04/20/2021  ? Essential hypertension 08/15/2019  ? Hypothyroidism 03/07/2013  ? Morbid obesity ([place]) 03/07/2013  ? ? ?Social Hx   ?Social History  ? ?Socioeconomic History  ? Marital status: Married  ?  Spouse name: Not on file  ? Number of children: Not on file  ? Years of education: Not on file  ? Highest education level: Not on file  ?Occupational History  ? Not on file  ?Tobacco Use  ? Smoking status: Never  ? Smokeless tobacco: Never  ?Substance and Sexual Activity  ? Alcohol use: Not Currently  ? Drug use: Never  ? Sexual activity: Not Currently  ?  Birth control/protection: Post-menopausal  ?Other Topics Concern  ? Not on file  ?Social History Narrative  ? Not on file  ? ?Social Determinants of Health  ? ?Financial Resource Strain: Not on file  ?Food Insecurity: Not on file  ?Transportation Needs: Not on file  ?Physical Activity: Not on file  ?Stress: Not on file  ?Social Connections: Not on file  ? ? ?Review of Systems ?Per HPI ? ?Objective:  ?BP 136/82   Pulse 66    Temp 97.8 ?F (36.6 ?C)   Wt 226 lb (102.5 kg)   LMP 06/28/2010   SpO2 100%   BMI 38.79 kg/m?  ? ? ?  04/20/2021  ? 11:06 AM 11/30/2020  ?  9:44 AM 10/31/2020  ?  2:20 PM  ?BP/Weight  ?Systolic BP 789 381 017  ?Diastolic BP 82 72 84  ?Wt. (Lbs) 226 239.8   ?BMI 38.79 kg/m2 41.16 kg/m2   ? ? ?Physical Exam ?Constitutional:   ?   General: She is not in acute distress. ?   Appearance: Normal appearance. She is obese.  ?HENT:  ?   Head: Normocephalic and atraumatic.  ?Eyes:  ?   General:     ?   Right eye: No discharge.     ?   Left eye: No discharge.  ?   Conjunctiva/sclera: Conjunctivae normal.  ?Cardiovascular:  ?   Rate and Rhythm: Normal rate and regular rhythm.  ?Pulmonary:  ?   Effort: Pulmonary effort is normal.  ?   Breath sounds: Normal breath sounds. No wheezing, rhonchi or rales.  ?Abdominal:  ?   General: There is no distension.  ?   Palpations: Abdomen is soft.  ?   Tenderness: There is no abdominal tenderness. There is no right CVA  tenderness or left CVA tenderness.  ?Neurological:  ?   Mental Status: She is alert.  ?Psychiatric:     ?   Mood and Affect: Mood normal.     ?   Behavior: Behavior normal.  ? ? ?Lab Results  ?Component Value Date  ? WBC 6.8 08/04/2020  ? HGB 12.0 08/04/2020  ? HCT 36.1 08/04/2020  ? PLT 258 08/04/2020  ? GLUCOSE 87 08/04/2020  ? CHOL 156 08/04/2020  ? TRIG 159 (H) 08/04/2020  ? HDL 48 08/04/2020  ? Victor 81 08/04/2020  ? ALT 28 08/04/2020  ? AST 13 08/04/2020  ? NA 144 08/04/2020  ? K 4.3 08/04/2020  ? CL 104 08/04/2020  ? CREATININE 0.82 08/04/2020  ? BUN 17 08/04/2020  ? CO2 26 08/04/2020  ? TSH 1.260 11/30/2020  ? ? ? ?Assessment & Plan:  ? ?Problem List Items Addressed This Visit   ? ?  ? Other  ? Flank pain - Primary  ?  Patient with left flank pain which is now radiating to the left lower abdomen and groin.  Blood noted on urinalysis.  Suspected kidney stone.  Arranging CT scan.  Advise increase fluid intake.  Starting on Flomax.  Hydrocodone as needed for pain. ?  ?   ? Relevant Orders  ? POCT Urinalysis Dipstick (Completed)  ? CT RENAL STONE STUDY  ? ? ?Meds ordered this encounter  ?Medications  ? tamsulosin (FLOMAX) 0.4 MG CAPS capsule  ?  Sig: Take 1 capsule (0.4 mg total) by mouth daily.  ?  Dispense:  14 capsule  ?  Refill:  0  ? HYDROcodone-acetaminophen (NORCO/VICODIN) 5-325 MG tablet  ?  Sig: Take 1 tablet by mouth every 8 (eight) hours as needed for moderate pain or severe pain.  ?  Dispense:  15 tablet  ?  Refill:  0  ? ?PDMP reviewed during this encounter. ? ?Thersa Salt DO ?[place] ? ?

## 2021-04-20 NOTE — Patient Instructions (Signed)
We will call with the results. ? ?Medication as prescribed. ? ?Take care ? ?Dr. Lacinda Axon  ?

## 2021-04-26 ENCOUNTER — Ambulatory Visit (INDEPENDENT_AMBULATORY_CARE_PROVIDER_SITE_OTHER): Payer: Medicare HMO | Admitting: Family Medicine

## 2021-04-26 DIAGNOSIS — R109 Unspecified abdominal pain: Secondary | ICD-10-CM

## 2021-04-26 MED ORDER — PREDNISONE 10 MG PO TABS: ORAL_TABLET | ORAL | 0 refills | Status: DC

## 2021-04-26 MED ORDER — TIZANIDINE HCL 4 MG PO TABS: 4.0000 mg | ORAL_TABLET | Freq: Three times a day (TID) | ORAL | 0 refills | Status: DC | PRN

## 2021-04-26 NOTE — Patient Instructions (Signed)
Rest. ? ?Medication as prescribed. ? ?Call with concerns. ? ?Take care ? ?Dr. Lacinda Axon  ?

## 2021-04-26 NOTE — Progress Notes (Signed)
? ?Subjective:  ?Patient ID: Kristen Drake, female    DOB: 1955-03-04  Age: 66 y.o. MRN: [id number] ? ?CC: ?Chief Complaint  ?Patient presents with  ? left low back pain   ?  Left hip pain - reports pain is better than before but still present , questions weather kidney stones, has an upcoming trip in 2 weeks   ? ? ?HPI: ? ?66 year old female presents with persistent left low back/flank pain.  Initial concern for kidney stone given history as well as blood noted in urine.  CT scan revealed no acute ureterolithiasis.  There were punctate stones noted in the kidneys bilaterally.  Patient reports that she has had ongoing pain but her pain is currently improved.  However she still has significant pain with activity and certain ranges of motion.  She has taken some leftover Flexeril with improvement.  Patient also reports pain in the left groin.  No fever.  No other associated symptoms.  No other complaints ? ?Patient Active Problem List  ? Diagnosis Date Noted  ? Flank pain 04/20/2021  ? Essential hypertension 08/15/2019  ? Hypothyroidism 03/07/2013  ? Morbid obesity ([place]) 03/07/2013  ? ? ?Social Hx   ?Social History  ? ?Socioeconomic History  ? Marital status: Married  ?  Spouse name: Not on file  ? Number of children: Not on file  ? Years of education: Not on file  ? Highest education level: Not on file  ?Occupational History  ? Not on file  ?Tobacco Use  ? Smoking status: Never  ? Smokeless tobacco: Never  ?Substance and Sexual Activity  ? Alcohol use: Not Currently  ? Drug use: Never  ? Sexual activity: Not Currently  ?  Birth control/protection: Post-menopausal  ?Other Topics Concern  ? Not on file  ?Social History Narrative  ? Not on file  ? ?Social Determinants of Health  ? ?Financial Resource Strain: Not on file  ?Food Insecurity: Not on file  ?Transportation Needs: Not on file  ?Physical Activity: Not on file  ?Stress: Not on file  ?Social Connections: Not on file  ? ? ?Review of Systems ?Per HPI ? ?Objective:   ?BP 129/88   Pulse 71   Temp 97.7 ?F (36.5 ?C)   Ht '5\' 4"'$  (1.626 m)   Wt 228 lb (103.4 kg)   LMP 06/28/2010   SpO2 99%   BMI 39.14 kg/m?  ? ? ?  04/26/2021  ? 10:29 AM 04/20/2021  ? 11:06 AM 11/30/2020  ?  9:44 AM  ?BP/Weight  ?Systolic BP 496 759 163  ?Diastolic BP 88 82 72  ?Wt. (Lbs) 228 226 239.8  ?BMI 39.14 kg/m2 38.79 kg/m2 41.16 kg/m2  ? ? ?Physical Exam ?Constitutional:   ?   General: She is not in acute distress. ?   Appearance: Normal appearance.  ?HENT:  ?   Head: Normocephalic and atraumatic.  ?Cardiovascular:  ?   Rate and Rhythm: Normal rate and regular rhythm.  ?   Heart sounds: No murmur heard. ?Pulmonary:  ?   Effort: Pulmonary effort is normal.  ?   Breath sounds: Normal breath sounds. No wheezing, rhonchi or rales.  ?Musculoskeletal:  ?   Comments: Pain with internal rotation of the left hip  ?Neurological:  ?   Mental Status: She is alert.  ? ? ?Lab Results  ?Component Value Date  ? WBC 6.8 08/04/2020  ? HGB 12.0 08/04/2020  ? HCT 36.1 08/04/2020  ? PLT 258 08/04/2020  ? GLUCOSE 87  08/04/2020  ? CHOL 156 08/04/2020  ? TRIG 159 (H) 08/04/2020  ? HDL 48 08/04/2020  ? Quinn 81 08/04/2020  ? ALT 28 08/04/2020  ? AST 13 08/04/2020  ? NA 144 08/04/2020  ? K 4.3 08/04/2020  ? CL 104 08/04/2020  ? CREATININE 0.82 08/04/2020  ? BUN 17 08/04/2020  ? CO2 26 08/04/2020  ? TSH 1.260 11/30/2020  ? ? ? ?Assessment & Plan:  ? ?Problem List Items Addressed This Visit   ? ?  ? Other  ? Flank pain  ?  Given recent work-up, this is likely musculoskeletal.  Offered additional imaging and patient declined.  Treating with prednisone and Zanaflex. ?  ?  ? ? ?Meds ordered this encounter  ?Medications  ? predniSONE (DELTASONE) 10 MG tablet  ?  Sig: 50 mg daily x 2 days, then 40 mg daily x 2 days, then 30 mg daily x 2 days, then 20 mg daily x 2 days, then 10 mg daily x 2 days.  ?  Dispense:  30 tablet  ?  Refill:  0  ? tiZANidine (ZANAFLEX) 4 MG tablet  ?  Sig: Take 1 tablet (4 mg total) by mouth every 8 (eight)  hours as needed for muscle spasms.  ?  Dispense:  30 tablet  ?  Refill:  0  ? ?Thersa Salt DO ?[place] ? ?

## 2021-04-26 NOTE — Assessment & Plan Note (Signed)
Given recent work-up, this is likely musculoskeletal.  Offered additional imaging and patient declined.  Treating with prednisone and Zanaflex. ?

## 2021-04-28 ENCOUNTER — Other Ambulatory Visit: Payer: Self-pay | Admitting: Obstetrics and Gynecology

## 2021-04-28 DIAGNOSIS — Z1231 Encounter for screening mammogram for malignant neoplasm of breast: Secondary | ICD-10-CM

## 2021-04-29 DIAGNOSIS — M1712 Unilateral primary osteoarthritis, left knee: Secondary | ICD-10-CM | POA: Diagnosis not present

## 2021-05-10 ENCOUNTER — Other Ambulatory Visit: Payer: Self-pay | Admitting: Family Medicine

## 2021-05-20 ENCOUNTER — Ambulatory Visit
Admission: RE | Admit: 2021-05-20 | Discharge: 2021-05-20 | Disposition: A | Discharge status: Home / Self Care | Payer: Medicare HMO | Source: Ambulatory Visit | Attending: Obstetrics and Gynecology | Admitting: Obstetrics and Gynecology

## 2021-05-20 DIAGNOSIS — Z1231 Encounter for screening mammogram for malignant neoplasm of breast: Secondary | ICD-10-CM

## 2021-06-28 ENCOUNTER — Other Ambulatory Visit: Payer: Self-pay | Admitting: Family Medicine

## 2021-07-08 DIAGNOSIS — H401131 Primary open-angle glaucoma, bilateral, mild stage: Secondary | ICD-10-CM | POA: Diagnosis not present

## 2021-07-12 ENCOUNTER — Ambulatory Visit
Admission: RE | Admit: 2021-07-12 | Discharge: 2021-07-12 | Disposition: A | Discharge status: Home / Self Care | Payer: Medicare HMO | Source: Ambulatory Visit | Attending: Nurse Practitioner | Admitting: Nurse Practitioner

## 2021-07-12 VITALS — BP 135/84 | HR 74 | Temp 98.1°F | Resp 18

## 2021-07-12 DIAGNOSIS — R21 Rash and other nonspecific skin eruption: Secondary | ICD-10-CM | POA: Diagnosis not present

## 2021-07-12 MED ORDER — TRIAMCINOLONE ACETONIDE 0.1 % EX CREA: 1.0000 | TOPICAL_CREAM | Freq: Two times a day (BID) | CUTANEOUS | 0 refills | Status: DC

## 2021-07-12 NOTE — Discharge Instructions (Addendum)
Use medication as prescribed. May take Benadryl at bedtime as needed for itching. Avoid rubbing, scratching, or manipulating the areas while symptoms persist. Cleanse the affected areas with warm soap and water. Cool compresses to the area to help with itching. May use over-the-counter Aveeno colloidal oatmeal bath to the affected areas to help dry the rash and to help with itching. As discussed, if your symptoms worsen or do not improve, please follow-up in this clinic or with PCP.

## 2021-07-12 NOTE — ED Provider Notes (Addendum)
[facility]    CSN: [id number] Arrival date & time: 07/12/21  1314      History   Chief Complaint Chief Complaint  Patient presents with   Poison Ivy    Entered by patient   Rash    HPI Kristen Drake is a 66 y.o. female.   The history is provided by the patient.   Patient presents with rash to her face has been present for the past 2 days.  Patient states "I think my dog got into poison ivy".  Rash is present to the left forehead and to the right jawline of her face.  She states rash is itchy.  She denies fever, chills, chest pain, abdominal pain, nausea, vomiting or diarrhea.  Patient also denies use of new medications soaps, detergents, or medications.  She has been using over-the-counter itch cream for her symptoms.  Past Medical History:  Diagnosis Date   Hypertension    Hypothyroid     Patient Active Problem List   Diagnosis Date Noted   Flank pain 04/20/2021   Essential hypertension 08/15/2019   Hypothyroidism 03/07/2013   Morbid obesity ([place]) 03/07/2013    Past Surgical History:  Procedure Laterality Date   ORTHOPEDIC SURGERY Right 07/09/2020   R foot bunion, hammer toe repair    OB History   No obstetric history on file.      Home Medications    Prior to Admission medications   Medication Sig Start Date End Date Taking? Authorizing Provider  triamcinolone cream (KENALOG) 0.1 % Apply 1 Application topically 2 (two) times daily. 07/12/21  Yes Ceria Suminski-Warren, Alda Lea, NP  aspirin 81 MG tablet Take 81 mg by mouth daily.    [provider]  hydrochlorothiazide (HYDRODIURIL) 25 MG tablet TAKE ONE (1) TABLET BY MOUTH EVERY DAY AS NEEDED FOR SWELLING 05/10/21   Kathyrn Drown, MD  HYDROcodone-acetaminophen (NORCO/VICODIN) 5-325 MG tablet Take 1 tablet by mouth every 8 (eight) hours as needed for moderate pain or severe pain. 04/20/21   Coral Spikes, DO  latanoprost (XALATAN) 0.005 % ophthalmic solution 1 drop at bedtime. 11/10/20    [provider]  levothyroxine (SYNTHROID) 88 MCG tablet TAKE ONE (1) TABLET BY MOUTH EVERY DAY 01/27/21   Kathyrn Drown, MD  lisinopril (ZESTRIL) 5 MG tablet TAKE ONE TABLET ('5MG'$  TOTAL) BY MOUTH DAILY FOR BLOOD PRESSURE 06/28/21   Kathyrn Drown, MD  naproxen sodium (ANAPROX) 220 MG tablet Take 220 mg by mouth 2 (two) times daily with a meal.    [provider]  nystatin-triamcinolone ointment (MYCOLOG) Apply 1 application topically 2 (two) times daily.    [provider]  OVER THE COUNTER MEDICATION Vitamin D one daily    [provider]  OVER THE COUNTER MEDICATION Calcium 600 mg 2 a day Hair, skin, nails vit 3 a day Iron '325mg'$  one a day Vit b complex one a day    [provider]  predniSONE (DELTASONE) 10 MG tablet 50 mg daily x 2 days, then 40 mg daily x 2 days, then 30 mg daily x 2 days, then 20 mg daily x 2 days, then 10 mg daily x 2 days. 04/26/21   Coral Spikes, DO  tamsulosin (FLOMAX) 0.4 MG CAPS capsule Take 1 capsule (0.4 mg total) by mouth daily. 04/20/21   Coral Spikes, DO  tiZANidine (ZANAFLEX) 4 MG tablet Take 1 tablet (4 mg total) by mouth every 8 (eight) hours as needed for muscle  spasms. 04/26/21   Coral Spikes, DO  FEMHRT LOW DOSE 0.5-2.5 MG-MCG per tablet  03/01/13 09/19/18  [provider]    Family History Family History  Problem Relation Age of Onset   Breast cancer Neg Hx     Social History Social History   Tobacco Use   Smoking status: Never   Smokeless tobacco: Never  Substance Use Topics   Alcohol use: Not Currently   Drug use: Never     Allergies   Patient has no known allergies.   Review of Systems Review of Systems Per HPI  Physical Exam Triage Vital Signs ED Triage Vitals  Enc Vitals Group     BP 07/12/21 1323 135/84     Pulse Rate 07/12/21 1323 74     Resp 07/12/21 1323 18     Temp 07/12/21 1323 98.1 F (36.7 C)     Temp src --      SpO2 07/12/21 1323 96 %     Weight --      Height  --      Head Circumference --      Peak Flow --      Pain Score 07/12/21 1322 0     Pain Loc --      Pain Edu? --      Excl. in [place]? --    No data found.  Updated Vital Signs BP 135/84   Pulse 74   Temp 98.1 F (36.7 C)   Resp 18   LMP 06/28/2010   SpO2 96%   Visual Acuity Right Eye Distance:   Left Eye Distance:   Bilateral Distance:    Right Eye Near:   Left Eye Near:    Bilateral Near:     Physical Exam Vitals and nursing note reviewed.  Constitutional:      General: She is not in acute distress.    Appearance: She is well-developed.  HENT:     Head: Normocephalic.  Cardiovascular:     Rate and Rhythm: Regular rhythm.     Heart sounds: Normal heart sounds.  Pulmonary:     Effort: Pulmonary effort is normal.     Breath sounds: Normal breath sounds.  Abdominal:     General: Bowel sounds are normal. There is no distension.     Palpations: Abdomen is soft.     Tenderness: There is no abdominal tenderness. There is no guarding or rebound.  Genitourinary:    Vagina: Normal. No vaginal discharge.  Musculoskeletal:     Cervical back: Normal range of motion.  Skin:    General: Skin is warm and dry.     Findings: Rash present. No erythema. Rash is macular, papular and urticarial. Rash is not crusting, nodular or purpuric.     Comments: Areas of papules and vesicles with surrounding erythema to the left forehead and right jaw line.   Neurological:     General: No focal deficit present.     Mental Status: She is alert and oriented to person, place, and time.     Cranial Nerves: No cranial nerve deficit.  Psychiatric:        Mood and Affect: Mood normal.        Behavior: Behavior normal.      UC Treatments / Results  Labs (all labs ordered are listed, but only abnormal results are displayed) Labs Reviewed - No data to display  EKG   Radiology No results found.  Procedures Procedures (including critical care time)  Medications Ordered in  UC Medications - No data to display  Initial Impression / Assessment and Plan / UC Course  I have reviewed the triage vital signs and the nursing notes.  Pertinent labs & imaging results that were available during my care of the patient were reviewed by me and considered in my medical decision making (see chart for details).  Patient presents with rash has been present for the past 2 days.  On exam, patient has maculopapular rash to her left forehead and to the right jaw line with an erythematous base.  Do not suspect shingles based on the current presentation of the rash.  We will treat patient symptomatically with triamcinolone cream.  Patient was advised to follow-up if the rash begins to spread or worsen, would not object to providing a prescription for prednisone if her symptoms worsen.  Patient would like to see if triamcinolone cream works at this time.  Supportive care recommendations were also provided.  Patient advised to follow-up as needed. Final Clinical Impressions(s) / UC Diagnoses   Final diagnoses:  Rash and nonspecific skin eruption     Discharge Instructions      Use medication as prescribed. May take Benadryl at bedtime as needed for itching. Avoid rubbing, scratching, or manipulating the areas while symptoms persist. Cleanse the affected areas with warm soap and water. Cool compresses to the area to help with itching. May use over-the-counter Aveeno colloidal oatmeal bath to the affected areas to help dry the rash and to help with itching. As discussed, if your symptoms worsen or do not improve, please follow-up in this clinic or with PCP.     ED Prescriptions     Medication Sig Dispense Auth. Provider   triamcinolone cream (KENALOG) 0.1 % Apply 1 Application topically 2 (two) times daily. 30 g Caiden Monsivais-Warren, Alda Lea, NP      PDMP not reviewed this encounter.   Tish Men, NP 07/12/21 1340    Monserrat Vidaurri-Warren, Alda Lea, NP 07/12/21  1341

## 2021-07-12 NOTE — ED Triage Notes (Signed)
Pt presents with c/o rash that believes to be poison ivy for past few days

## 2021-07-20 ENCOUNTER — Other Ambulatory Visit: Payer: Self-pay | Admitting: Family Medicine

## 2021-07-20 ENCOUNTER — Other Ambulatory Visit: Payer: Self-pay | Admitting: Nurse Practitioner

## 2021-07-21 NOTE — Telephone Encounter (Signed)
May have 4 refills 

## 2021-07-26 ENCOUNTER — Encounter: Payer: Self-pay | Admitting: Family Medicine

## 2021-07-26 ENCOUNTER — Ambulatory Visit (INDEPENDENT_AMBULATORY_CARE_PROVIDER_SITE_OTHER): Payer: Medicare HMO | Admitting: Family Medicine

## 2021-07-26 VITALS — BP 112/78 | Temp 97.9°F | Wt 230.6 lb

## 2021-07-26 DIAGNOSIS — E039 Hypothyroidism, unspecified: Secondary | ICD-10-CM | POA: Diagnosis not present

## 2021-07-26 DIAGNOSIS — E781 Pure hyperglyceridemia: Secondary | ICD-10-CM

## 2021-07-26 DIAGNOSIS — Z23 Encounter for immunization: Secondary | ICD-10-CM

## 2021-07-26 DIAGNOSIS — I10 Essential (primary) hypertension: Secondary | ICD-10-CM

## 2021-07-26 DIAGNOSIS — Z79899 Other long term (current) drug therapy: Secondary | ICD-10-CM | POA: Diagnosis not present

## 2021-07-26 MED ORDER — LISINOPRIL 5 MG PO TABS: ORAL_TABLET | ORAL | 1 refills | Status: DC

## 2021-07-26 NOTE — Progress Notes (Signed)
   Subjective:    Patient ID: Kristen Drake, female    DOB: October 07, 1955, 66 y.o.   MRN: [id number]  HPI Pt here for surgical clearance. Pt having left knee surgery. Date is to be determined. Pt is wanting to knee when to have thyroid labs done.  Patient is going to have partial knee replacement Appears to be under spinal She denies any chest tightness pressure pain or shortness of breath with activity She does have difficulty walking up a flight of steps but this is because of her knee not because of her heart and lungs.  Review of Systems     Objective:   Physical Exam General-in no acute distress Eyes-no discharge Lungs-respiratory rate normal, CTA CV-no murmurs,RRR Extremities skin warm dry no edema Neuro grossly normal Behavior normal, alert        Assessment & Plan:  1. Essential hypertension Blood pressure under very good control continue healthy eating after knee surgery fit in more walking  2. Hypothyroidism, unspecified type Continue thyroid medication Will check TSH  3. Hypertriglyceridemia Check lipid profile.  Presurgical clearance-CBC will send copy of all lab work and today's office note to orthopedics Patient is aware that there is no such thing as no risk surgery but it that her overall risk is considered low-she does not need any advanced cardiovascular evaluation  Morbid obesity healthy diet regular activity and getting the knee surgery will help her become more active

## 2021-07-27 LAB — CBC WITH DIFFERENTIAL/PLATELET
Basophils Absolute: 0 10*3/uL (ref 0.0–0.2)
Basos: 1 %
EOS (ABSOLUTE): 0.3 10*3/uL (ref 0.0–0.4)
Eos: 5 %
Hematocrit: 34.7 % (ref 34.0–46.6)
Hemoglobin: 11.7 g/dL (ref 11.1–15.9)
Immature Grans (Abs): 0 10*3/uL (ref 0.0–0.1)
Immature Granulocytes: 0 %
Lymphocytes Absolute: 1.6 10*3/uL (ref 0.7–3.1)
Lymphs: 25 %
MCH: 30.6 pg (ref 26.6–33.0)
MCHC: 33.7 g/dL (ref 31.5–35.7)
MCV: 91 fL (ref 79–97)
Monocytes Absolute: 0.4 10*3/uL (ref 0.1–0.9)
Monocytes: 6 %
Neutrophils Absolute: 4.1 10*3/uL (ref 1.4–7.0)
Neutrophils: 63 %
Platelets: 254 10*3/uL (ref 150–450)
RBC: 3.82 x10E6/uL (ref 3.77–5.28)
RDW: 13.3 % (ref 11.7–15.4)
WBC: 6.4 10*3/uL (ref 3.4–10.8)

## 2021-07-27 LAB — HEPATIC FUNCTION PANEL
ALT: 16 IU/L (ref 0–32)
AST: 17 IU/L (ref 0–40)
Albumin: 4.6 g/dL (ref 3.8–4.8)
Alkaline Phosphatase: 70 IU/L (ref 44–121)
Bilirubin Total: 0.3 mg/dL (ref 0.0–1.2)
Bilirubin, Direct: <0.1 mg/dL (ref 0.00–0.40)
Total Protein: 6.9 g/dL (ref 6.0–8.5)

## 2021-07-27 LAB — LIPID PANEL
Chol/HDL Ratio: 3 ratio (ref 0.0–4.4)
Cholesterol, Total: 193 mg/dL (ref 100–199)
HDL: 65 mg/dL (ref >39)
LDL Chol Calc (NIH): 114 mg/dL — ABNORMAL HIGH (ref 0–99)
Triglycerides: 77 mg/dL (ref 0–149)
VLDL Cholesterol Cal: 14 mg/dL (ref 5–40)

## 2021-07-27 LAB — BASIC METABOLIC PANEL
BUN/Creatinine Ratio: 26 (ref 12–28)
BUN: 22 mg/dL (ref 8–27)
CO2: 23 mmol/L (ref 20–29)
Calcium: 9.6 mg/dL (ref 8.7–10.3)
Chloride: 103 mmol/L (ref 96–106)
Creatinine, Ser: 0.85 mg/dL (ref 0.57–1.00)
Glucose: 92 mg/dL (ref 70–99)
Potassium: 4.5 mmol/L (ref 3.5–5.2)
Sodium: 143 mmol/L (ref 134–144)
eGFR: 76 mL/min/{1.73_m2} (ref >59)

## 2021-07-27 LAB — TSH: TSH: 1.72 u[IU]/mL (ref 0.450–4.500)

## 2021-08-16 ENCOUNTER — Other Ambulatory Visit: Payer: Self-pay | Admitting: Family Medicine

## 2021-09-22 DIAGNOSIS — M94262 Chondromalacia, left knee: Secondary | ICD-10-CM | POA: Diagnosis not present

## 2021-09-22 DIAGNOSIS — M1712 Unilateral primary osteoarthritis, left knee: Secondary | ICD-10-CM | POA: Diagnosis not present

## 2021-09-22 DIAGNOSIS — G8918 Other acute postprocedural pain: Secondary | ICD-10-CM | POA: Diagnosis not present

## 2021-09-30 DIAGNOSIS — Z96652 Presence of left artificial knee joint: Secondary | ICD-10-CM | POA: Diagnosis not present

## 2021-09-30 DIAGNOSIS — M25662 Stiffness of left knee, not elsewhere classified: Secondary | ICD-10-CM | POA: Diagnosis not present

## 2021-09-30 DIAGNOSIS — M62562 Muscle wasting and atrophy, not elsewhere classified, left lower leg: Secondary | ICD-10-CM | POA: Diagnosis not present

## 2021-09-30 DIAGNOSIS — M1712 Unilateral primary osteoarthritis, left knee: Secondary | ICD-10-CM | POA: Diagnosis not present

## 2021-09-30 DIAGNOSIS — M25562 Pain in left knee: Secondary | ICD-10-CM | POA: Diagnosis not present

## 2021-10-06 DIAGNOSIS — M1712 Unilateral primary osteoarthritis, left knee: Secondary | ICD-10-CM | POA: Diagnosis not present

## 2021-10-06 DIAGNOSIS — M25662 Stiffness of left knee, not elsewhere classified: Secondary | ICD-10-CM | POA: Diagnosis not present

## 2021-10-06 DIAGNOSIS — M62562 Muscle wasting and atrophy, not elsewhere classified, left lower leg: Secondary | ICD-10-CM | POA: Diagnosis not present

## 2021-10-06 DIAGNOSIS — M25562 Pain in left knee: Secondary | ICD-10-CM | POA: Diagnosis not present

## 2021-10-08 DIAGNOSIS — M62562 Muscle wasting and atrophy, not elsewhere classified, left lower leg: Secondary | ICD-10-CM | POA: Diagnosis not present

## 2021-10-08 DIAGNOSIS — M25562 Pain in left knee: Secondary | ICD-10-CM | POA: Diagnosis not present

## 2021-10-08 DIAGNOSIS — M1712 Unilateral primary osteoarthritis, left knee: Secondary | ICD-10-CM | POA: Diagnosis not present

## 2021-10-08 DIAGNOSIS — M25662 Stiffness of left knee, not elsewhere classified: Secondary | ICD-10-CM | POA: Diagnosis not present

## 2021-10-12 DIAGNOSIS — M25662 Stiffness of left knee, not elsewhere classified: Secondary | ICD-10-CM | POA: Diagnosis not present

## 2021-10-12 DIAGNOSIS — M62562 Muscle wasting and atrophy, not elsewhere classified, left lower leg: Secondary | ICD-10-CM | POA: Diagnosis not present

## 2021-10-12 DIAGNOSIS — M25562 Pain in left knee: Secondary | ICD-10-CM | POA: Diagnosis not present

## 2021-10-12 DIAGNOSIS — M1712 Unilateral primary osteoarthritis, left knee: Secondary | ICD-10-CM | POA: Diagnosis not present

## 2021-10-15 DIAGNOSIS — M25662 Stiffness of left knee, not elsewhere classified: Secondary | ICD-10-CM | POA: Diagnosis not present

## 2021-10-15 DIAGNOSIS — M62562 Muscle wasting and atrophy, not elsewhere classified, left lower leg: Secondary | ICD-10-CM | POA: Diagnosis not present

## 2021-10-15 DIAGNOSIS — M25562 Pain in left knee: Secondary | ICD-10-CM | POA: Diagnosis not present

## 2021-10-15 DIAGNOSIS — M1712 Unilateral primary osteoarthritis, left knee: Secondary | ICD-10-CM | POA: Diagnosis not present

## 2021-10-19 DIAGNOSIS — M25662 Stiffness of left knee, not elsewhere classified: Secondary | ICD-10-CM | POA: Diagnosis not present

## 2021-10-19 DIAGNOSIS — M25562 Pain in left knee: Secondary | ICD-10-CM | POA: Diagnosis not present

## 2021-10-19 DIAGNOSIS — M1712 Unilateral primary osteoarthritis, left knee: Secondary | ICD-10-CM | POA: Diagnosis not present

## 2021-10-19 DIAGNOSIS — M62562 Muscle wasting and atrophy, not elsewhere classified, left lower leg: Secondary | ICD-10-CM | POA: Diagnosis not present

## 2021-10-27 DIAGNOSIS — M25562 Pain in left knee: Secondary | ICD-10-CM | POA: Diagnosis not present

## 2021-10-27 DIAGNOSIS — M25662 Stiffness of left knee, not elsewhere classified: Secondary | ICD-10-CM | POA: Diagnosis not present

## 2021-10-27 DIAGNOSIS — M1712 Unilateral primary osteoarthritis, left knee: Secondary | ICD-10-CM | POA: Diagnosis not present

## 2021-10-27 DIAGNOSIS — M62562 Muscle wasting and atrophy, not elsewhere classified, left lower leg: Secondary | ICD-10-CM | POA: Diagnosis not present

## 2021-10-29 DIAGNOSIS — M1712 Unilateral primary osteoarthritis, left knee: Secondary | ICD-10-CM | POA: Diagnosis not present

## 2021-10-29 DIAGNOSIS — M62562 Muscle wasting and atrophy, not elsewhere classified, left lower leg: Secondary | ICD-10-CM | POA: Diagnosis not present

## 2021-10-29 DIAGNOSIS — M25562 Pain in left knee: Secondary | ICD-10-CM | POA: Diagnosis not present

## 2021-10-29 DIAGNOSIS — M25662 Stiffness of left knee, not elsewhere classified: Secondary | ICD-10-CM | POA: Diagnosis not present

## 2021-11-03 DIAGNOSIS — M1712 Unilateral primary osteoarthritis, left knee: Secondary | ICD-10-CM | POA: Diagnosis not present

## 2021-11-03 DIAGNOSIS — M25562 Pain in left knee: Secondary | ICD-10-CM | POA: Diagnosis not present

## 2021-11-03 DIAGNOSIS — M25662 Stiffness of left knee, not elsewhere classified: Secondary | ICD-10-CM | POA: Diagnosis not present

## 2021-11-03 DIAGNOSIS — M62562 Muscle wasting and atrophy, not elsewhere classified, left lower leg: Secondary | ICD-10-CM | POA: Diagnosis not present

## 2021-11-11 ENCOUNTER — Ambulatory Visit: Payer: Medicare HMO | Admitting: Podiatry

## 2021-11-11 ENCOUNTER — Ambulatory Visit (INDEPENDENT_AMBULATORY_CARE_PROVIDER_SITE_OTHER): Payer: Medicare HMO

## 2021-11-11 ENCOUNTER — Encounter: Payer: Self-pay | Admitting: Podiatry

## 2021-11-11 DIAGNOSIS — S99921A Unspecified injury of right foot, initial encounter: Secondary | ICD-10-CM | POA: Diagnosis not present

## 2021-11-11 DIAGNOSIS — M76821 Posterior tibial tendinitis, right leg: Secondary | ICD-10-CM | POA: Diagnosis not present

## 2021-11-11 DIAGNOSIS — Q828 Other specified congenital malformations of skin: Secondary | ICD-10-CM | POA: Diagnosis not present

## 2021-11-11 MED ORDER — TRIAMCINOLONE ACETONIDE 10 MG/ML IJ SUSP
10.0000 mg | Freq: Once | INTRAMUSCULAR | Status: AC
  Administered 2021-11-11: 10 mg

## 2021-11-12 NOTE — Progress Notes (Signed)
Subjective:   Patient ID: Kristen Drake, female   DOB: 66 y.o.   MRN: [id number]   HPI Patient states she has had a lot of pain in her right foot with a flattening of the arch and inflammation associated with it.  States its been present for 3 months patient has just had knee replacement and putting a lot more pressure on her right side   ROS      Objective:  Physical Exam  Neurovascular status was found to be intact muscle strength was found to be adequate with posterior tibial tendon found to be within normal limits.  Patient has quite a bit of pain in the posterior tibial tendon as it comes under the medial malleolus and gets near its insertion into the navicular with a moderate flatfoot deformity noted.  Patient was also noted to have a lesion on the outside of the right foot painful when pressed especially when she tries to walk on it and has had previous foot surgery     Assessment:  Probability for posterior tibial tendinitis right along with porokeratotic lesion lateral side right painful when pressed with flatfoot deformity     Plan:  H&P x-ray reviewed and I Minna focus on the posterior tibial and I did do a careful sheath injection after explaining risk with 3 mg Dexasone Kenalog 5 mg Xylocaine applied fascial brace to lift up the arch fitting it correctly into her arch and to take pressure off posterior tib and then did sterile debridement of lesion right.  Patient will be seen back to recheck in the next few weeks all questions answered  X-rays indicate that there is flatfoot deformity of a mild nature right no indication stress fracture arthritis

## 2021-12-20 ENCOUNTER — Encounter: Payer: Self-pay | Admitting: Nurse Practitioner

## 2021-12-20 ENCOUNTER — Ambulatory Visit (INDEPENDENT_AMBULATORY_CARE_PROVIDER_SITE_OTHER): Payer: Medicare HMO | Admitting: Nurse Practitioner

## 2021-12-20 VITALS — BP 136/76 | HR 67 | Temp 96.8°F | Ht 64.0 in | Wt 238.4 lb

## 2021-12-20 DIAGNOSIS — Z0000 Encounter for general adult medical examination without abnormal findings: Secondary | ICD-10-CM

## 2021-12-20 NOTE — Progress Notes (Signed)
   Subjective:    Patient ID: Kristen Drake, female    DOB: Aug 26, 1955, 66 y.o.   MRN: [id number]  HPI AWV- Annual Wellness Visit  The patient was seen for their annual wellness visit. The patient's past medical history, surgical history, and family history were reviewed. Pertinent vaccines were reviewed ( tetanus, pneumonia, shingles, flu) The patient's medication list was reviewed and updated.  The height and weight were entered.  BMI recorded in electronic record elsewhere  Cognitive screening was completed. Outcome of Mini - Cog: pass   Falls /depression screening electronically recorded within record elsewhere  Current tobacco usage: none  (All patients who use tobacco were given written and verbal information on quitting)  Recent listing of emergency department/hospitalizations over the past year were reviewed.  current specialist the patient sees on a regular basis: none   Medicare annual wellness visit patient questionnaire was reviewed.  A written screening schedule for the patient for the next 5-10 years was given. Appropriate discussion of followup regarding next visit was discussed.      Review of Systems  All other systems reviewed and are negative.      Objective:   Physical Exam Constitutional:      General: She is not in acute distress.    Appearance: Normal appearance. She is obese. She is not ill-appearing, toxic-appearing or diaphoretic.  HENT:     Head: Normocephalic and atraumatic.  Neurological:     Mental Status: She is alert.  Psychiatric:        Mood and Affect: Mood normal.        Behavior: Behavior normal.           Assessment & Plan:   Adult wellness-complete.wellness physical was conducted today. Importance of diet and exercise were discussed in detail.  Importance of stress reduction and healthy living were discussed.  In addition to this a discussion regarding safety was also covered.  We also reviewed over immunizations and  gave recommendations regarding current immunization needed for age.   In addition to this additional areas were also touched on including: Preventative health exams needed:  Colonoscopy up to date. Due 2026 Mammogram up to date. Due 2024 Hep C and HIV screening up to date Flu vaccine up to date Pneumococcal up to date COVID Vaccine #3 declined PAP smear due by 02/2022. Patient's last PAP 5 years ago. Guidelines to stop at 65 however since it has been 5 years since last PAP patient will get one last PAP. No history of abnormal PAPs. Next PAP will be last PAP as long as normal. Shingles vaccine due. Patient to scheduled to get vaccine   Patient was advised yearly wellness exam

## 2021-12-20 NOTE — Patient Instructions (Signed)
Thank you for coming for your annual wellness visit.  Please follow through on any advice that was given to you by today's visit. Remember to maintain compliance with your medications as discussed today.  Also remember it is important to eat a healthy diet and to stay physically active on a daily basis.  Please follow through with any testing or recommended followup office visits as was discussed today. You are due the following test coming up:  Colonoscopy up to date. Due 2026 Mammogram up to date. Due 2024 Hep C and HIV screening up to date Flu vaccine up to date Pneumococcal up to date COVID Vaccine #3 declined today PAP smear due by 02/2022. Next PAP will be last PAP as long as normal. Shingles vaccine due. Patient to scheduled to get vaccine      Finally remembered that the annual wellness visit does not take the place of regularly scheduled office visits  chronic health problems such as hypertension/diabetes/cholesterol visits.

## 2022-01-04 DIAGNOSIS — H40013 Open angle with borderline findings, low risk, bilateral: Secondary | ICD-10-CM | POA: Diagnosis not present

## 2022-01-09 ENCOUNTER — Ambulatory Visit: Payer: Self-pay

## 2022-01-14 ENCOUNTER — Other Ambulatory Visit: Payer: Self-pay | Admitting: Family Medicine

## 2022-02-25 ENCOUNTER — Ambulatory Visit: Payer: Self-pay

## 2022-04-04 ENCOUNTER — Other Ambulatory Visit: Payer: Self-pay

## 2022-04-04 MED ORDER — LISINOPRIL 5 MG PO TABS: ORAL_TABLET | ORAL | 1 refills | Status: DC

## 2022-04-11 ENCOUNTER — Other Ambulatory Visit: Payer: Self-pay | Admitting: Family Medicine

## 2022-04-11 DIAGNOSIS — Z1231 Encounter for screening mammogram for malignant neoplasm of breast: Secondary | ICD-10-CM

## 2022-04-14 ENCOUNTER — Other Ambulatory Visit: Payer: Self-pay | Admitting: Family Medicine

## 2022-04-27 ENCOUNTER — Ambulatory Visit (INDEPENDENT_AMBULATORY_CARE_PROVIDER_SITE_OTHER): Payer: Medicare HMO | Admitting: Family Medicine

## 2022-04-27 VITALS — BP 121/77 | HR 67 | Temp 97.2°F | Ht 64.0 in | Wt 241.0 lb

## 2022-04-27 DIAGNOSIS — E039 Hypothyroidism, unspecified: Secondary | ICD-10-CM

## 2022-04-27 DIAGNOSIS — E7849 Other hyperlipidemia: Secondary | ICD-10-CM

## 2022-04-27 DIAGNOSIS — I10 Essential (primary) hypertension: Secondary | ICD-10-CM

## 2022-04-27 MED ORDER — HYDROCHLOROTHIAZIDE 25 MG PO TABS: ORAL_TABLET | ORAL | 1 refills | Status: DC

## 2022-04-27 MED ORDER — LEVOTHYROXINE SODIUM 88 MCG PO TABS: ORAL_TABLET | ORAL | 1 refills | Status: DC

## 2022-04-27 NOTE — Patient Instructions (Signed)
Please do your labs in May or June and we will let you know the results when we get them.  It was good seeing you today.  Please follow-up every 6 months as planned.  TakeCare-Dr. Sallee Lange

## 2022-04-27 NOTE — Progress Notes (Signed)
   Subjective:    Patient ID: Kristen Drake, female    DOB: 09/12/1955, 67 y.o.   MRN: [id number]  Hypertension This is a chronic problem. Treatments tried: HCTZ, lisinopril.  Patient for blood pressure check up.  The patient does have hypertension.   Patient relates dietary measures try to minimize salt The importance of healthy diet and activity were discussed Patient relates compliance   Hypothyroidism follow up takes her thyroid medicine regular basis denies noncompliance  She is trying to work on dietary choices and healthier eating to try to bring her weight down able to fit in some walking but does have some orthopedic issues -  Review of Systems Thank you    Objective:   Physical Exam General-in no acute distress Eyes-no discharge Lungs-respiratory rate normal, CTA CV-no murmurs,RRR Extremities skin warm dry no edema Neuro grossly normal Behavior normal, alert        Assessment & Plan:  1. Essential hypertension HTN- patient seen for follow-up regarding HTN.   Diet, medication compliance, appropriate labs and refills were completed.   Importance of keeping blood pressure under good control to lessen the risk of complications discussed Regular follow-up visits discussed  - CMP14+EGFR - Microalbumin/Creatinine Ratio, Urine  2. Hypothyroidism, unspecified type Continue thyroid medication Check lab work Await results Patient was seen today regarding hypothyroidism.  Importance of healthy diet, regular physical activity was discussed.  Importance of compliance with medication and regular checks regarding this was discussed.   - TSH  3. Other hyperlipidemia Healthy diet check lab work no statin indicated currently Follow-up if progressive troubles  - Lipid Panel  4. Morbid obesity Significant obesity.  Very important to work hard on healthy diet regular physical activity.  Portion control and minimizing carbohydrates and excessive sweets.  Fitting and  regular physical activity would be wise.   Side effects of GLP-1's discussed.  Currently will not prescribe but if covered by her insurance and she desires to do so we can work with this.

## 2022-05-24 ENCOUNTER — Ambulatory Visit
Admission: RE | Admit: 2022-05-24 | Discharge: 2022-05-24 | Disposition: A | Discharge status: Home / Self Care | Payer: Medicare HMO | Source: Ambulatory Visit | Attending: Family Medicine | Admitting: Family Medicine

## 2022-05-24 DIAGNOSIS — Z1231 Encounter for screening mammogram for malignant neoplasm of breast: Secondary | ICD-10-CM | POA: Diagnosis not present

## 2022-05-31 DIAGNOSIS — H25013 Cortical age-related cataract, bilateral: Secondary | ICD-10-CM | POA: Diagnosis not present

## 2022-05-31 DIAGNOSIS — H401130 Primary open-angle glaucoma, bilateral, stage unspecified: Secondary | ICD-10-CM | POA: Diagnosis not present

## 2022-07-14 DIAGNOSIS — E7849 Other hyperlipidemia: Secondary | ICD-10-CM | POA: Diagnosis not present

## 2022-07-14 DIAGNOSIS — I10 Essential (primary) hypertension: Secondary | ICD-10-CM | POA: Diagnosis not present

## 2022-07-14 DIAGNOSIS — E039 Hypothyroidism, unspecified: Secondary | ICD-10-CM | POA: Diagnosis not present

## 2022-07-15 LAB — LIPID PANEL
Chol/HDL Ratio: 2.6 ratio (ref 0.0–4.4)
Cholesterol, Total: 173 mg/dL (ref 100–199)
HDL: 67 mg/dL (ref >39)
LDL Chol Calc (NIH): 90 mg/dL (ref 0–99)
Triglycerides: 90 mg/dL (ref 0–149)
VLDL Cholesterol Cal: 16 mg/dL (ref 5–40)

## 2022-07-15 LAB — MICROALBUMIN / CREATININE URINE RATIO
Creatinine, Urine: 62.2 mg/dL
Microalb/Creat Ratio: <5 mg/g creat (ref 0–29)
Microalbumin, Urine: <3 ug/mL

## 2022-07-15 LAB — CMP14+EGFR
ALT: 25 IU/L (ref 0–32)
AST: 27 IU/L (ref 0–40)
Albumin: 4.3 g/dL (ref 3.9–4.9)
Alkaline Phosphatase: 87 IU/L (ref 44–121)
BUN/Creatinine Ratio: 23 (ref 12–28)
BUN: 19 mg/dL (ref 8–27)
Bilirubin Total: 0.4 mg/dL (ref 0.0–1.2)
CO2: 23 mmol/L (ref 20–29)
Calcium: 9.8 mg/dL (ref 8.7–10.3)
Chloride: 103 mmol/L (ref 96–106)
Creatinine, Ser: 0.83 mg/dL (ref 0.57–1.00)
Globulin, Total: 2.8 g/dL (ref 1.5–4.5)
Glucose: 91 mg/dL (ref 70–99)
Potassium: 4.4 mmol/L (ref 3.5–5.2)
Sodium: 141 mmol/L (ref 134–144)
Total Protein: 7.1 g/dL (ref 6.0–8.5)
eGFR: 78 mL/min/{1.73_m2} (ref >59)

## 2022-07-15 LAB — TSH: TSH: 2.07 u[IU]/mL (ref 0.450–4.500)

## 2022-08-23 DIAGNOSIS — L820 Inflamed seborrheic keratosis: Secondary | ICD-10-CM | POA: Diagnosis not present

## 2022-09-27 ENCOUNTER — Other Ambulatory Visit: Payer: Self-pay | Admitting: Family Medicine

## 2022-09-29 DIAGNOSIS — Z96652 Presence of left artificial knee joint: Secondary | ICD-10-CM | POA: Diagnosis not present

## 2023-01-16 ENCOUNTER — Other Ambulatory Visit: Payer: Self-pay | Admitting: Family Medicine

## 2023-02-10 ENCOUNTER — Ambulatory Visit (INDEPENDENT_AMBULATORY_CARE_PROVIDER_SITE_OTHER): Payer: HMO | Admitting: Nurse Practitioner

## 2023-02-10 VITALS — BP 118/78 | HR 65 | Temp 98.1°F | Ht 64.0 in | Wt 250.8 lb

## 2023-02-10 DIAGNOSIS — I10 Essential (primary) hypertension: Secondary | ICD-10-CM | POA: Diagnosis not present

## 2023-02-10 DIAGNOSIS — E039 Hypothyroidism, unspecified: Secondary | ICD-10-CM | POA: Diagnosis not present

## 2023-02-11 ENCOUNTER — Encounter: Payer: Self-pay | Admitting: Nurse Practitioner

## 2023-02-11 MED ORDER — ALBUTEROL SULFATE HFA 108 (90 BASE) MCG/ACT IN AERS: 2.0000 | INHALATION_SPRAY | Freq: Four times a day (QID) | RESPIRATORY_TRACT | 0 refills | Status: AC | PRN

## 2023-02-11 NOTE — Progress Notes (Signed)
Subjective:    Patient ID: Kristen Drake, female    DOB: [birth year], 68 y.o.   MRN: [id number]  HPI Presents for routine follow-up for hypertension and hypothyroidism. Adherent to medication regimen. BP outside the office running around 140/90. Activity: Does at least 1 hour of cardio each day at the gym and is trying to add more activities.  Has several orthopedic issues but trying to be as active as possible. Diet: Has stopped her sweet tea intake.  States she still has too much sugar in her diet which she is trying to reduce.  Has started getting a meal program with fresh foods which are healthier.  Has been trying to watch her carb intake. Requesting a refill on her albuterol inhaler.  Rarely has to use it maybe once every couple of months.  Has struggled some with the extreme cold weather.  Review of Systems  HENT:  Negative for sore throat and trouble swallowing.   Respiratory:  Negative for cough, chest tightness and shortness of breath.   Cardiovascular:  Negative for chest pain.  Neurological:  Negative for syncope, facial asymmetry, speech difficulty, weakness, numbness and headaches.       Objective:   Physical Exam NAD.  Alert, oriented.  Calm cheerful affect.  Lungs clear.  Heart regular rate rhythm.  Thyroid nontender to palpation, no mass or goiter noted.  Carotids no bruits or thrills.  Lungs clear.  Heart regular rate rhythm.  Results for orders placed or performed in visit on 04/27/22  CMP14+EGFR   Collection Time: 07/14/22  8:15 AM  Result Value Ref Range   Glucose 91 70 - 99 mg/dL   BUN 19 8 - 27 mg/dL   Creatinine, Ser 4.09 0.57 - 1.00 mg/dL   eGFR 78 >81 XB/JYN/8.29   BUN/Creatinine Ratio 23 12 - 28   Sodium 141 134 - 144 mmol/L   Potassium 4.4 3.5 - 5.2 mmol/L   Chloride 103 96 - 106 mmol/L   CO2 23 20 - 29 mmol/L   Calcium 9.8 8.7 - 10.3 mg/dL   Total Protein 7.1 6.0 - 8.5 g/dL   Albumin 4.3 3.9 - 4.9 g/dL   Globulin, Total 2.8 1.5 - 4.5 g/dL    Bilirubin Total 0.4 0.0 - 1.2 mg/dL   Alkaline Phosphatase 87 44 - 121 IU/L   AST 27 0 - 40 IU/L   ALT 25 0 - 32 IU/L  Microalbumin/Creatinine Ratio, Urine   Collection Time: 07/14/22  8:15 AM  Result Value Ref Range   Creatinine, Urine 62.2 Not Estab. mg/dL   Microalbumin, Urine <5.6 Not Estab. ug/mL   Microalb/Creat Ratio <5 0 - 29 mg/g creat  Lipid Panel   Collection Time: 07/14/22  8:15 AM  Result Value Ref Range   Cholesterol, Total 173 100 - 199 mg/dL   Triglycerides 90 0 - 149 mg/dL   HDL 67 >21 mg/dL   VLDL Cholesterol Cal 16 5 - 40 mg/dL   LDL Chol Calc (NIH) 90 0 - 99 mg/dL   Chol/HDL Ratio 2.6 0.0 - 4.4 ratio  TSH   Collection Time: 07/14/22  8:15 AM  Result Value Ref Range   TSH 2.070 0.450 - 4.500 uIU/mL   Today's Vitals   02/10/23 1447  BP: 118/78  Pulse: 65  Temp: 98.1 F (36.7 C)  SpO2: 99%  Weight: 250 lb 12.8 oz (113.8 kg)  Height: 5\' 4"  (1.626 m)   Body mass index is 43.05 kg/m.  Assessment & Plan:   Problem List Items Addressed This Visit       Cardiovascular and Mediastinum   Essential hypertension - Primary     Endocrine   Hypothyroidism   Meds ordered this encounter  Medications   albuterol (VENTOLIN HFA) 108 (90 Base) MCG/ACT inhaler    Sig: Inhale 2 puffs into the lungs every 6 (six) hours as needed for wheezing or shortness of breath.    Dispense:  8 g    Refill:  0    Supervising Provider:   Lilyan Punt A [[id number]]   Continue current medication regimen as directed. Reviewed recent lab work and addressed any concerns. Continue activity and working on her diet. Given refill on albuterol inhaler to have on hand, contact office if using this more than 2-3 times per week. Continue to monitor BP outside of the office. Reminded patient about preventive health physical. Return in about 6 months (around 08/10/2023).

## 2023-02-26 ENCOUNTER — Ambulatory Visit
Admission: RE | Admit: 2023-02-26 | Discharge: 2023-02-26 | Disposition: A | Discharge status: Home / Self Care | Payer: HMO | Source: Ambulatory Visit | Attending: Nurse Practitioner | Admitting: Nurse Practitioner

## 2023-02-26 ENCOUNTER — Other Ambulatory Visit: Payer: Self-pay

## 2023-02-26 VITALS — BP 117/79 | HR 80 | Temp 98.7°F | Resp 20

## 2023-02-26 DIAGNOSIS — J101 Influenza due to other identified influenza virus with other respiratory manifestations: Secondary | ICD-10-CM

## 2023-02-26 DIAGNOSIS — J069 Acute upper respiratory infection, unspecified: Secondary | ICD-10-CM

## 2023-02-26 LAB — POC COVID19/FLU A&B COMBO
Covid Antigen, POC: NEGATIVE
Influenza A Antigen, POC: POSITIVE — AB
Influenza B Antigen, POC: NEGATIVE

## 2023-02-26 MED ORDER — OSELTAMIVIR PHOSPHATE 75 MG PO CAPS: 75.0000 mg | ORAL_CAPSULE | Freq: Two times a day (BID) | ORAL | 0 refills | Status: DC

## 2023-02-26 MED ORDER — PROMETHAZINE-DM 6.25-15 MG/5ML PO SYRP: 5.0000 mL | ORAL_SOLUTION | Freq: Four times a day (QID) | ORAL | 0 refills | Status: DC | PRN

## 2023-02-26 NOTE — ED Provider Notes (Signed)
[facility]    CSN: [id number] Arrival date & time: 02/26/23  0947      History   Chief Complaint Chief Complaint  Patient presents with   Cough    Achy, weak - Entered by patient   Fever    HPI Kristen Drake is a 68 y.o. female.   The history is provided by the patient.   Patient presents for complaints of fever, chest tightness, and cough.  States symptoms started over the past 3 days.  Tmax around 100.  Denies chills, headache, sore throat, nasal congestion, runny nose, difficulty breathing, abdominal pain, nausea, vomiting, diarrhea, or rash.  Patient states that she does have an inhaler for asthma symptoms.  States that she did have some wheezing along with the chest tightness, used her albuterol inhaler, but symptoms improved.  States that she has been using [place] for her cough.    Past Medical History:  Diagnosis Date   Hypertension    Hypothyroid     Patient Active Problem List   Diagnosis Date Noted   Flank pain 04/20/2021   Essential hypertension 08/15/2019   Hypothyroidism 03/07/2013   Morbid obesity (HCC) 03/07/2013    Past Surgical History:  Procedure Laterality Date   ORTHOPEDIC SURGERY Right 07/09/2020   R foot bunion, hammer toe repair    OB History   No obstetric history on file.      Home Medications    Prior to Admission medications   Medication Sig Start Date End Date Taking? Authorizing Provider  albuterol (VENTOLIN HFA) 108 (90 Base) MCG/ACT inhaler Inhale 2 puffs into the lungs every 6 (six) hours as needed for wheezing or shortness of breath. 02/11/23  Yes Campbell Riches, NP  hydrochlorothiazide (HYDRODIURIL) 25 MG tablet 1 qam 04/27/22  Yes Luking, Scott A, MD  latanoprost (XALATAN) 0.005 % ophthalmic solution 1 drop at bedtime. 11/10/20  Yes [provider]  levothyroxine (SYNTHROID) 88 MCG tablet TAKE ONE TABLET BY MOUTH EVERY MORNING 01/16/23  Yes Luking, Scott A, MD  lisinopril (ZESTRIL) 5 MG tablet  TAKE ONE TABLET (5MG  TOTAL) BY MOUTH DAILY FOR BLOOD PRESSURE 09/28/22  Yes Babs Sciara, MD  naproxen sodium (ANAPROX) 220 MG tablet Take 220 mg by mouth 2 (two) times daily with a meal.   Yes [provider]  FEMHRT LOW DOSE 0.5-2.5 MG-MCG per tablet  03/01/13 09/19/18  [provider]    Family History Family History  Problem Relation Age of Onset   Breast cancer Neg Hx     Social History Social History   Tobacco Use   Smoking status: Never   Smokeless tobacco: Never  Substance Use Topics   Alcohol use: Not Currently   Drug use: Never     Allergies   Patient has no known allergies.   Review of Systems Review of Systems Per HPI  Physical Exam Triage Vital Signs ED Triage Vitals  Encounter Vitals Group     BP 02/26/23 1029 117/79     Systolic BP Percentile --      Diastolic BP Percentile --      Pulse Rate 02/26/23 1029 80     Resp 02/26/23 1029 20     Temp 02/26/23 1029 98.7 F (37.1 C)     Temp src --      SpO2 02/26/23 1029 95 %     Weight --      Height --      Head Circumference --  Peak Flow --      Pain Score 02/26/23 1027 0     Pain Loc --      Pain Education --      Exclude from Growth Chart --    No data found.  Updated Vital Signs BP 117/79   Pulse 80   Temp 98.7 F (37.1 C)   Resp 20   LMP 06/28/2010   SpO2 95%   Visual Acuity Right Eye Distance:   Left Eye Distance:   Bilateral Distance:    Right Eye Near:   Left Eye Near:    Bilateral Near:     Physical Exam Vitals and nursing note reviewed.  Constitutional:      General: She is not in acute distress.    Appearance: Normal appearance.  HENT:     Head: Normocephalic.     Right Ear: Tympanic membrane, ear canal and external ear normal.     Left Ear: Tympanic membrane, ear canal and external ear normal.     Nose: Congestion present.     Right Turbinates: Enlarged and swollen.     Left Turbinates: Enlarged and swollen.     Right Sinus: No maxillary  sinus tenderness or frontal sinus tenderness.     Left Sinus: No maxillary sinus tenderness or frontal sinus tenderness.     Mouth/Throat:     Lips: Pink.     Mouth: Mucous membranes are moist.     Pharynx: Uvula midline. Postnasal drip present. No pharyngeal swelling, oropharyngeal exudate, posterior oropharyngeal erythema or uvula swelling.  Eyes:     Extraocular Movements: Extraocular movements intact.     Conjunctiva/sclera: Conjunctivae normal.     Pupils: Pupils are equal, round, and reactive to light.  Cardiovascular:     Rate and Rhythm: Regular rhythm.     Pulses: Normal pulses.     Heart sounds: Normal heart sounds.  Pulmonary:     Effort: Pulmonary effort is normal. No respiratory distress.     Breath sounds: Normal breath sounds. No stridor. No wheezing, rhonchi or rales.  Abdominal:     General: Bowel sounds are normal.     Palpations: Abdomen is soft.     Tenderness: There is no abdominal tenderness.  Musculoskeletal:     Cervical back: Normal range of motion.  Lymphadenopathy:     Cervical: No cervical adenopathy.  Skin:    General: Skin is warm and dry.  Neurological:     General: No focal deficit present.     Mental Status: She is alert and oriented to person, place, and time.  Psychiatric:        Mood and Affect: Mood normal.        Behavior: Behavior normal.      UC Treatments / Results  Labs (all labs ordered are listed, but only abnormal results are displayed) Labs Reviewed  POC COVID19/FLU A&B COMBO    EKG   Radiology No results found.  Procedures Procedures (including critical care time)  Medications Ordered in UC Medications - No data to display  Initial Impression / Assessment and Plan / UC Course  I have reviewed the triage vital signs and the nursing notes.  Pertinent labs & imaging results that were available during my care of the patient were reviewed by me and considered in my medical decision making (see chart for  details).  COVID/flu test was positive for influenza A.   Tamiflu 75 mg x 5 days along with Promethazine DM for cough.  On exam, lung sounds are clear throughout, room air sats at 95%.  Supportive care recommendations were provided and discussed with the patient to include fluids, rest, use of a humidifier at nighttime during sleep.  Discussed indications for follow-up.  Patient was in agreement with this plan of care and verbalizes understanding.  All questions were answered.  Patient stable for discharge.  Final Clinical Impressions(s) / UC Diagnoses   Final diagnoses:  None   Discharge Instructions   None    ED Prescriptions   None    PDMP not reviewed this encounter.   Abran Cantor, NP 02/26/23 1113

## 2023-02-26 NOTE — Discharge Instructions (Addendum)
The COVID test was positive for influenza A. May take over-the-counter Tylenol or ibuprofen as needed for pain, fever, or general discomfort. Recommend use of a humidifier in your bedroom at nighttime during sleep and sleeping elevated on pillows while cough symptoms persist. Continue use of your albuterol inhaler as needed for wheezing or shortness of breath. You should remain home until you have been fever free for 24 hours with no medication. Symptoms should improve over the next 5 to 7 days.  If symptoms fail to improve, or worsen, you may follow-up in this clinic with your primary care physician for further evaluation. Follow-up as needed.

## 2023-02-26 NOTE — ED Triage Notes (Signed)
PT reports a cough since Thursday night and a fever last night.

## 2023-03-03 DIAGNOSIS — M25551 Pain in right hip: Secondary | ICD-10-CM | POA: Diagnosis not present

## 2023-03-06 ENCOUNTER — Ambulatory Visit: Payer: Medicare HMO | Admitting: Family Medicine

## 2023-03-27 ENCOUNTER — Other Ambulatory Visit: Payer: Self-pay | Admitting: Family Medicine

## 2023-04-20 ENCOUNTER — Other Ambulatory Visit: Payer: Self-pay | Admitting: Family Medicine

## 2023-04-20 DIAGNOSIS — Z1231 Encounter for screening mammogram for malignant neoplasm of breast: Secondary | ICD-10-CM

## 2023-05-02 DIAGNOSIS — M25551 Pain in right hip: Secondary | ICD-10-CM | POA: Diagnosis not present

## 2023-05-02 DIAGNOSIS — M545 Low back pain, unspecified: Secondary | ICD-10-CM | POA: Diagnosis not present

## 2023-05-02 DIAGNOSIS — M791 Myalgia, unspecified site: Secondary | ICD-10-CM | POA: Diagnosis not present

## 2023-05-05 ENCOUNTER — Ambulatory Visit: Payer: HMO

## 2023-05-05 VITALS — Ht 64.0 in | Wt 250.0 lb

## 2023-05-05 DIAGNOSIS — Z0000 Encounter for general adult medical examination without abnormal findings: Secondary | ICD-10-CM | POA: Diagnosis not present

## 2023-05-05 DIAGNOSIS — Z780 Asymptomatic menopausal state: Secondary | ICD-10-CM | POA: Diagnosis not present

## 2023-05-05 NOTE — Patient Instructions (Signed)
 Ms. Kristen Drake , Thank you for taking time to come for your Medicare Wellness Visit. I appreciate your ongoing commitment to your health goals. Please review the following plan we discussed and let me know if I can assist you in the future.   Referrals/Orders/Follow-Ups/Clinician Recommendations: You have an order for:  []   2D Mammogram  []   3D Mammogram  [x]   Bone Density     Please call for appointment:  [facility] [address] -Radiology [place], [place] [address] [phone]  Make sure to wear two-piece clothing.  No lotions, powders, or deodorants the day of the appointment. Make sure to bring picture ID and insurance card.  Bring list of medications you are currently taking including any supplements.    This is a list of the screening recommended for you and due dates:  Health Maintenance  Topic Date Due   COVID-19 Vaccine (3 - Moderna risk series) 06/14/2019   Zoster (Shingles) Vaccine (1 of 2) 05/12/2023*   DTaP/Tdap/Td vaccine (1 - Tdap) 02/11/2024*   Flu Shot  08/25/2023   Colon Cancer Screening  03/17/2024   Medicare Annual Wellness Visit  05/04/2024   Mammogram  05/23/2024   Pneumonia Vaccine  Completed   DEXA scan (bone density measurement)  Completed   Hepatitis C Screening  Completed   HPV Vaccine  Aged Out   Meningitis B Vaccine  Aged Out  *Topic was postponed. The date shown is not the original due date.    Advanced directives: (ACP Link)Information on Advanced Care Planning can be found at [facility] Advance Health Care Directives Advance Health Care Directives. [email or web address]   Next Medicare Annual Wellness Visit scheduled for next year: Yes

## 2023-05-05 NOTE — Progress Notes (Signed)
 Subjective:   Kristen Drake is a 68 y.o. who presents for a Medicare Wellness preventive visit.  Visit Complete: Virtual I connected with  Kristen Drake on 05/05/23 by a audio enabled telemedicine application and verified that I am speaking with the correct person using two identifiers.  Patient Location: Home  Provider Location: Home Office  I discussed the limitations of evaluation and management by telemedicine. The patient expressed understanding and agreed to proceed.  Vital Signs: Because this visit was a virtual/telehealth visit, some criteria may be missing or patient reported. Any vitals not documented were not able to be obtained and vitals that have been documented are patient reported.  VideoDeclined- This patient declined Librarian, academic. Therefore the visit was completed with audio only.  Persons Participating in Visit: Patient.  AWV Questionnaire: No: Patient Medicare AWV questionnaire was not completed prior to this visit.  Cardiac Risk Factors include: advanced age (>11men, >47 women);hypertension     Objective:    Today's Vitals   05/05/23 1030  Weight: 250 lb (113.4 kg)  Height: 5\' 4"  (1.626 m)   Body mass index is 42.91 kg/m.     05/05/2023   10:35 AM 10/25/2017   12:57 PM 10/13/2017    1:01 PM  Advanced Directives  Does Patient Have a Medical Advance Directive? No Yes Yes  Type of Special educational needs teacher of [place];Living will Healthcare Power of [place];Living will  Does patient want to make changes to medical advance directive?   No - Patient declined  Copy of Healthcare Power of Attorney in Chart?  No - copy requested No - copy requested  Would patient like information on creating a medical advance directive? Yes (MAU/Ambulatory/Procedural Areas - Information given)      Current Medications (verified) Outpatient Encounter Medications as of 05/05/2023  Medication Sig   albuterol (VENTOLIN HFA) 108 (90  Base) MCG/ACT inhaler Inhale 2 puffs into the lungs every 6 (six) hours as needed for wheezing or shortness of breath.   hydrochlorothiazide (HYDRODIURIL) 25 MG tablet 1 qam   latanoprost (XALATAN) 0.005 % ophthalmic solution 1 drop at bedtime.   levothyroxine (SYNTHROID) 88 MCG tablet TAKE ONE TABLET BY MOUTH EVERY MORNING   lisinopril (ZESTRIL) 5 MG tablet TAKE ONE TABLET (5MG  TOTAL) BY MOUTH DAILY FOR BLOOD PRESSURE   naproxen sodium (ANAPROX) 220 MG tablet Take 220 mg by mouth 2 (two) times daily with a meal.   oseltamivir (TAMIFLU) 75 MG capsule Take 1 capsule (75 mg total) by mouth every 12 (twelve) hours.   predniSONE (DELTASONE) 10 MG tablet Take by mouth.   promethazine-dextromethorphan (PROMETHAZINE-DM) 6.25-15 MG/5ML syrup Take 5 mLs by mouth 4 (four) times daily as needed.   [DISCONTINUED] FEMHRT LOW DOSE 0.5-2.5 MG-MCG per tablet    No facility-administered encounter medications on file as of 05/05/2023.    Allergies (verified) Patient has no known allergies.   History: Past Medical History:  Diagnosis Date   Hypertension    Hypothyroid    Past Surgical History:  Procedure Laterality Date   ORTHOPEDIC SURGERY Right 07/09/2020   R foot bunion, hammer toe repair   Family History  Problem Relation Age of Onset   Breast cancer Neg Hx    Social History   Socioeconomic History   Marital status: Married    Spouse name: Not on file   Number of children: Not on file   Years of education: Not on file   Highest education level: Not on  file  Occupational History   Not on file  Tobacco Use   Smoking status: Never   Smokeless tobacco: Never  Substance and Sexual Activity   Alcohol use: Not Currently   Drug use: Never   Sexual activity: Not Currently    Birth control/protection: Post-menopausal  Other Topics Concern   Not on file  Social History Narrative   Not on file   Social Drivers of Health   Financial Resource Strain: Low Risk  (05/05/2023)   Overall  Financial Resource Strain (CARDIA)    Difficulty of Paying Living Expenses: Not hard at all  Food Insecurity: No Food Insecurity (05/05/2023)   Hunger Vital Sign    Worried About Running Out of Food in the Last Year: Never true    Ran Out of Food in the Last Year: Never true  Transportation Needs: No Transportation Needs (05/05/2023)   PRAPARE - Administrator, Civil Service (Medical): No    Lack of Transportation (Non-Medical): No  Physical Activity: Insufficiently Active (05/05/2023)   Exercise Vital Sign    Days of Exercise per Week: 3 days    Minutes of Exercise per Session: 30 min  Stress: No Stress Concern Present (05/05/2023)   [employer] of Occupational Health - Occupational Stress Questionnaire    Feeling of Stress : Not at all  Social Connections: Socially Integrated (05/05/2023)   Social Connection and Isolation Panel [NHANES]    Frequency of Communication with Friends and Family: More than three times a week    Frequency of Social Gatherings with Friends and Family: Three times a week    Attends Religious Services: More than 4 times per year    Active Member of Clubs or Organizations: Yes    Attends Banker Meetings: 1 to 4 times per year    Marital Status: Married    Tobacco Counseling Counseling given: Not Answered    Clinical Intake:  Pre-visit preparation completed: Yes  Pain : No/denies pain     Diabetes: No  No results found for: "HGBA1C"   How often do you need to have someone help you when you read instructions, pamphlets, or other written materials from your doctor or pharmacy?: 1 - Never  Interpreter Needed?: No  Information entered by :: Kandis Fantasia LPN   Activities of Daily Living     05/05/2023   10:35 AM  In your present state of health, do you have any difficulty performing the following activities:  Hearing? 0  Vision? 0  Difficulty concentrating or making decisions? 0  Walking or climbing stairs? 0   Dressing or bathing? 0  Doing errands, shopping? 0  Preparing Food and eating ? N  Using the Toilet? N  In the past six months, have you accidently leaked urine? N  Do you have problems with loss of bowel control? N  Managing your Medications? N  Managing your Finances? N  Housekeeping or managing your Housekeeping? N    Patient Care Team: Babs Sciara, MD as PCP - General (Family Medicine)  Indicate any recent Medical Services you may have received from other than [facility] providers in the past year (date may be approximate).     Assessment:   This is a routine wellness examination for Kristen Drake.  Hearing/Vision screen Hearing Screening - Comments:: Denies hearing difficulties   Vision Screening - Comments:: Wears rx glasses - up to date with routine eye exams with [facility]     Goals Addressed  This Visit's Progress    Remain active and independent         Depression Screen     05/05/2023   10:34 AM 04/27/2022    8:28 AM 12/20/2021    9:26 AM 04/26/2021   10:33 AM 07/23/2020    3:10 PM 08/15/2019   11:00 AM 07/21/2017   10:56 AM  PHQ 2/9 Scores  PHQ - 2 Score 0 0 0 0 0 2 0  PHQ- 9 Score  0    4     Fall Risk     05/05/2023   10:34 AM 04/27/2022    8:29 AM 12/20/2021    9:26 AM 04/26/2021   10:33 AM 07/23/2020    3:10 PM  Fall Risk   Falls in the past year? 0 0 0 0 0  Number falls in past yr: 0 0 0 0 0  Injury with Fall? 0 0 0 0 0  Risk for fall due to : No Fall Risks No Fall Risks No Fall Risks No Fall Risks No Fall Risks  Follow up Falls prevention discussed;Education provided;Falls evaluation completed Falls evaluation completed Falls evaluation completed Falls evaluation completed Falls evaluation completed    MEDICARE RISK AT HOME:  Medicare Risk at Home Any stairs in or around the home?: No If so, are there any without handrails?: No Home free of loose throw rugs in walkways, pet beds, electrical cords, etc?: Yes Adequate lighting in your home  to reduce risk of falls?: Yes Life alert?: No Use of a cane, walker or w/c?: No Grab bars in the bathroom?: Yes Shower chair or bench in shower?: No Elevated toilet seat or a handicapped toilet?: Yes  TIMED UP AND GO:  Was the test performed?  No  Cognitive Function: 6CIT completed        05/05/2023   10:35 AM  6CIT Screen  What Year? 0 points  What month? 0 points  What time? 0 points  Count back from 20 0 points  Months in reverse 0 points  Repeat phrase 0 points  Total Score 0 points    Immunizations Immunization History  Administered Date(s) Administered   Influenza,inj,Quad PF,6+ Mos 11/15/2019, 11/30/2020   Influenza-Unspecified 11/09/2017, 12/10/2021   Moderna Sars-Covid-2 Vaccination 04/19/2019, 05/17/2019   PNEUMOCOCCAL CONJUGATE-20 07/26/2021    Screening Tests Health Maintenance  Topic Date Due   COVID-19 Vaccine (3 - Moderna risk series) 06/14/2019   Zoster Vaccines- Shingrix (1 of 2) 05/12/2023 (Originally 03/19/1974)   DTaP/Tdap/Td (1 - Tdap) 02/11/2024 (Originally 03/19/1974)   INFLUENZA VACCINE  08/25/2023   Colonoscopy  03/17/2024   Medicare Annual Wellness (AWV)  05/04/2024   MAMMOGRAM  05/23/2024   Pneumonia Vaccine 77+ Years old  Completed   DEXA SCAN  Completed   Hepatitis C Screening  Completed   HPV VACCINES  Aged Out   Meningococcal B Vaccine  Aged Out    Health Maintenance  Health Maintenance Due  Topic Date Due   COVID-19 Vaccine (3 - Moderna risk series) 06/14/2019   Health Maintenance Items Addressed: DEXA ordered  Additional Screening:  Vision Screening: Recommended annual ophthalmology exams for early detection of glaucoma and other disorders of the eye.  Dental Screening: Recommended annual dental exams for proper oral hygiene  Community Resource Referral / Chronic Care Management: CRR required this visit?  No   CCM required this visit?  No     Plan:     I have personally reviewed and noted the following in the  patient's chart:   Medical and social history Use of alcohol, tobacco or illicit drugs  Current medications and supplements including opioid prescriptions. Patient is not currently taking opioid prescriptions. Functional ability and status Nutritional status Physical activity Advanced directives List of other physicians Hospitalizations, surgeries, and ER visits in previous 12 months Vitals Screenings to include cognitive, depression, and falls Referrals and appointments  In addition, I have reviewed and discussed with patient certain preventive protocols, quality metrics, and best practice recommendations. A written personalized care plan for preventive services as well as general preventive health recommendations were provided to patient.     Kandis Fantasia [place], [place]   1/61/0960   After Visit Summary: (MyChart) Due to this being a telephonic visit, the after visit summary with patients personalized plan was offered to patient via MyChart   Notes: Nothing significant to report at this time.

## 2023-05-17 DIAGNOSIS — M545 Low back pain, unspecified: Secondary | ICD-10-CM | POA: Diagnosis not present

## 2023-05-17 DIAGNOSIS — M791 Myalgia, unspecified site: Secondary | ICD-10-CM | POA: Diagnosis not present

## 2023-05-17 DIAGNOSIS — M25551 Pain in right hip: Secondary | ICD-10-CM | POA: Diagnosis not present

## 2023-05-25 ENCOUNTER — Ambulatory Visit
Admission: RE | Admit: 2023-05-25 | Discharge: 2023-05-25 | Disposition: A | Discharge status: Home / Self Care | Source: Ambulatory Visit | Attending: Family Medicine | Admitting: Family Medicine

## 2023-05-25 DIAGNOSIS — Z1231 Encounter for screening mammogram for malignant neoplasm of breast: Secondary | ICD-10-CM

## 2023-06-02 DIAGNOSIS — M5416 Radiculopathy, lumbar region: Secondary | ICD-10-CM | POA: Diagnosis not present

## 2023-06-07 DIAGNOSIS — M533 Sacrococcygeal disorders, not elsewhere classified: Secondary | ICD-10-CM | POA: Diagnosis not present

## 2023-06-07 DIAGNOSIS — M47816 Spondylosis without myelopathy or radiculopathy, lumbar region: Secondary | ICD-10-CM | POA: Diagnosis not present

## 2023-06-13 ENCOUNTER — Other Ambulatory Visit: Payer: Self-pay | Admitting: Family Medicine

## 2023-06-14 DIAGNOSIS — M533 Sacrococcygeal disorders, not elsewhere classified: Secondary | ICD-10-CM | POA: Diagnosis not present

## 2023-06-30 ENCOUNTER — Ambulatory Visit: Admitting: Nurse Practitioner

## 2023-06-30 VITALS — BP 112/75 | HR 72 | Temp 98.1°F | Ht 64.0 in | Wt 237.0 lb

## 2023-06-30 DIAGNOSIS — R5383 Other fatigue: Secondary | ICD-10-CM

## 2023-06-30 DIAGNOSIS — S50811A Abrasion of right forearm, initial encounter: Secondary | ICD-10-CM

## 2023-06-30 DIAGNOSIS — Z23 Encounter for immunization: Secondary | ICD-10-CM | POA: Diagnosis not present

## 2023-06-30 DIAGNOSIS — Z01419 Encounter for gynecological examination (general) (routine) without abnormal findings: Secondary | ICD-10-CM

## 2023-06-30 DIAGNOSIS — I10 Essential (primary) hypertension: Secondary | ICD-10-CM | POA: Diagnosis not present

## 2023-06-30 DIAGNOSIS — Z1151 Encounter for screening for human papillomavirus (HPV): Secondary | ICD-10-CM

## 2023-06-30 DIAGNOSIS — Z124 Encounter for screening for malignant neoplasm of cervix: Secondary | ICD-10-CM | POA: Diagnosis not present

## 2023-06-30 DIAGNOSIS — E039 Hypothyroidism, unspecified: Secondary | ICD-10-CM

## 2023-06-30 DIAGNOSIS — Z01411 Encounter for gynecological examination (general) (routine) with abnormal findings: Secondary | ICD-10-CM | POA: Diagnosis not present

## 2023-06-30 NOTE — Progress Notes (Signed)
 Subjective:    Patient ID: Kristen Drake, female    DOB: 01-29-1955, 68 y.o.   MRN: [id number]  HPI The patient comes in today for a wellness visit.    A review of their health history was completed.  A review of medications was also completed.  Any needed refills; No  Eating habits: has lost weight on a high protein, low carb diet  Falls/  MVA accidents in past few months: No  Regular exercise: Yes; yard work, helping with remodeling  Specialist pt sees on regular basis: Ortho for hip and back pain  Preventative health issues were discussed.   Additional concerns: None at this time Has frequent abrasions due to her work; has one now on right arm Regular vision and dental exams Mammogram up to date Had MRI through ortho with incidental finding of renal cyst; results unavailable during visit Married, same female sexual partner; postmenopausal with no vaginal bleeding  Review of Systems  Constitutional:  Negative for activity change, appetite change and fatigue.  HENT:  Negative for sore throat and trouble swallowing.   Respiratory:  Negative for cough, chest tightness, shortness of breath and wheezing.   Cardiovascular:  Negative for chest pain.  Gastrointestinal:  Negative for abdominal distention, abdominal pain, constipation, diarrhea, nausea and vomiting.  Genitourinary:  Negative for difficulty urinating, dysuria, enuresis, frequency, genital sores, pelvic pain, urgency, vaginal bleeding and vaginal discharge.      06/30/2023    9:05 AM  Depression screen PHQ 2/9  Decreased Interest 0  Down, Depressed, Hopeless 0  PHQ - 2 Score 0  Altered sleeping 0  Tired, decreased energy 0  Change in appetite 0  Feeling bad or failure about yourself  0  Trouble concentrating 0  Moving slowly or fidgety/restless 0  Suicidal thoughts 0  PHQ-9 Score 0      06/30/2023    9:05 AM 04/27/2022    8:28 AM  GAD 7 : Generalized Anxiety Score  Nervous, Anxious, on Edge 0 0   Control/stop worrying 0 0  Worry too much - different things 0 0  Trouble relaxing 0 0  Restless 0 0  Easily annoyed or irritable 0 0  Afraid - awful might happen 0 0  Total GAD 7 Score 0 0  Anxiety Difficulty  Not difficult at all         Objective:   Physical Exam Vitals and nursing note reviewed. Chaperone present: Defers chaperone..  Constitutional:      General: She is not in acute distress.    Appearance: She is well-developed.  Neck:     Thyroid : No thyromegaly.     Trachea: No tracheal deviation.     Comments: Thyroid  non tender to palpation. No mass or goiter noted.  Cardiovascular:     Rate and Rhythm: Normal rate and regular rhythm.     Heart sounds: Normal heart sounds. No murmur heard. Pulmonary:     Effort: Pulmonary effort is normal.     Breath sounds: Normal breath sounds.  Chest:  Breasts:    Right: No swelling, inverted nipple, mass, skin change or tenderness.     Left: No swelling, inverted nipple, mass, skin change or tenderness.  Abdominal:     General: There is no distension.     Palpations: Abdomen is soft.     Tenderness: There is no abdominal tenderness.  Genitourinary:    General: Normal vulva.     Labia:  Right: No rash, tenderness or lesion.        Left: No rash, tenderness or lesion.      Vagina: No vaginal discharge, erythema, tenderness, bleeding or lesions.     Cervix: No cervical motion tenderness, discharge, lesion, erythema or cervical bleeding.     Comments: Bimanual exam: no tenderness or obvious masses.  Musculoskeletal:     Cervical back: Normal range of motion and neck supple.  Lymphadenopathy:     Cervical: No cervical adenopathy.     Upper Body:     Right upper body: No supraclavicular, axillary or pectoral adenopathy.     Left upper body: No supraclavicular, axillary or pectoral adenopathy.  Skin:    General: Skin is warm and dry.     Comments: Superficial abrasion noted dorsal aspect right forearm. No evidence  of infection. Has other smaller abrasions on both arms.   Neurological:     Mental Status: She is alert and oriented to person, place, and time.  Psychiatric:        Mood and Affect: Mood normal.        Behavior: Behavior normal.        Thought Content: Thought content normal.        Judgment: Judgment normal.    Today's Vitals   06/30/23 0847  BP: 112/75  Pulse: 72  Temp: 98.1 F (36.7 C)  SpO2: 97%  Weight: 237 lb (107.5 kg)  Height: 5\' 4"  (1.626 m)   Body mass index is 40.68 kg/m.         Assessment & Plan:   Problem List Items Addressed This Visit       Cardiovascular and Mediastinum   Essential hypertension   Relevant Orders   Comprehensive metabolic panel with GFR (Completed)   Lipid panel (Completed)     Endocrine   Hypothyroidism   Relevant Orders   TSH (Completed)   Other Visit Diagnoses       Well woman exam    -  Primary     Fatigue, unspecified type       Relevant Orders   CBC with Differential/Platelet (Completed)   Comprehensive metabolic panel with GFR (Completed)   TSH (Completed)     Screening for cervical cancer       Relevant Orders   IGP, Aptima HPV     Screening for HPV (human papillomavirus)       Relevant Orders   IGP, Aptima HPV     Abrasion of right forearm, initial encounter       Relevant Orders   Td : Tetanus/diphtheria >7yo Preservative  free (Completed)      Labs ordered. Tetanus vaccine today. Contact office if any signs of infection. PAP smear pending.  Continue healthy diet, activity and weight loss efforts.  Bone density test has been ordered. Given information so she can call and schedule.  Return in about 1 year (around 06/29/2024) for physical.

## 2023-07-01 ENCOUNTER — Encounter: Payer: Self-pay | Admitting: Nurse Practitioner

## 2023-07-01 ENCOUNTER — Ambulatory Visit: Payer: Self-pay | Admitting: Nurse Practitioner

## 2023-07-01 LAB — CBC WITH DIFFERENTIAL/PLATELET
Basophils Absolute: 0 10*3/uL (ref 0.0–0.2)
Basos: 1 %
EOS (ABSOLUTE): 0.2 10*3/uL (ref 0.0–0.4)
Eos: 4 %
Hematocrit: 41.3 % (ref 34.0–46.6)
Hemoglobin: 13.8 g/dL (ref 11.1–15.9)
Immature Grans (Abs): 0 10*3/uL (ref 0.0–0.1)
Immature Granulocytes: 0 %
Lymphocytes Absolute: 1.2 10*3/uL (ref 0.7–3.1)
Lymphs: 23 %
MCH: 31.2 pg (ref 26.6–33.0)
MCHC: 33.4 g/dL (ref 31.5–35.7)
MCV: 93 fL (ref 79–97)
Monocytes Absolute: 0.5 10*3/uL (ref 0.1–0.9)
Monocytes: 9 %
Neutrophils Absolute: 3.4 10*3/uL (ref 1.4–7.0)
Neutrophils: 63 %
Platelets: 272 10*3/uL (ref 150–450)
RBC: 4.43 x10E6/uL (ref 3.77–5.28)
RDW: 13.5 % (ref 11.7–15.4)
WBC: 5.4 10*3/uL (ref 3.4–10.8)

## 2023-07-01 LAB — COMPREHENSIVE METABOLIC PANEL WITH GFR
ALT: 28 IU/L (ref 0–32)
AST: 29 IU/L (ref 0–40)
Albumin: 4.6 g/dL (ref 3.9–4.9)
Alkaline Phosphatase: 72 IU/L (ref 44–121)
BUN/Creatinine Ratio: 30 — ABNORMAL HIGH (ref 12–28)
BUN: 21 mg/dL (ref 8–27)
Bilirubin Total: 0.3 mg/dL (ref 0.0–1.2)
CO2: 23 mmol/L (ref 20–29)
Calcium: 10.1 mg/dL (ref 8.7–10.3)
Chloride: 101 mmol/L (ref 96–106)
Creatinine, Ser: 0.7 mg/dL (ref 0.57–1.00)
Globulin, Total: 2.5 g/dL (ref 1.5–4.5)
Glucose: 99 mg/dL (ref 70–99)
Potassium: 4.5 mmol/L (ref 3.5–5.2)
Sodium: 140 mmol/L (ref 134–144)
Total Protein: 7.1 g/dL (ref 6.0–8.5)
eGFR: 95 mL/min/{1.73_m2} (ref >59)

## 2023-07-01 LAB — LIPID PANEL
Chol/HDL Ratio: 3.1 ratio (ref 0.0–4.4)
Cholesterol, Total: 185 mg/dL (ref 100–199)
HDL: 60 mg/dL (ref >39)
LDL Chol Calc (NIH): 109 mg/dL — ABNORMAL HIGH (ref 0–99)
Triglycerides: 88 mg/dL (ref 0–149)
VLDL Cholesterol Cal: 16 mg/dL (ref 5–40)

## 2023-07-01 LAB — TSH: TSH: 1.65 u[IU]/mL (ref 0.450–4.500)

## 2023-07-05 DIAGNOSIS — M47816 Spondylosis without myelopathy or radiculopathy, lumbar region: Secondary | ICD-10-CM | POA: Diagnosis not present

## 2023-07-05 DIAGNOSIS — M533 Sacrococcygeal disorders, not elsewhere classified: Secondary | ICD-10-CM | POA: Diagnosis not present

## 2023-07-05 LAB — IGP, APTIMA HPV: HPV Aptima: NEGATIVE

## 2023-07-12 ENCOUNTER — Ambulatory Visit (HOSPITAL_COMMUNITY)
Admission: RE | Admit: 2023-07-12 | Discharge: 2023-07-12 | Disposition: A | Discharge status: Home / Self Care | Source: Ambulatory Visit | Attending: Family Medicine | Admitting: Family Medicine

## 2023-07-12 ENCOUNTER — Ambulatory Visit: Payer: Self-pay | Admitting: Family Medicine

## 2023-07-12 DIAGNOSIS — Z780 Asymptomatic menopausal state: Secondary | ICD-10-CM | POA: Diagnosis not present

## 2023-07-12 DIAGNOSIS — M8588 Other specified disorders of bone density and structure, other site: Secondary | ICD-10-CM | POA: Diagnosis not present

## 2023-07-19 ENCOUNTER — Other Ambulatory Visit: Payer: Self-pay | Admitting: Family Medicine

## 2023-07-31 DIAGNOSIS — M533 Sacrococcygeal disorders, not elsewhere classified: Secondary | ICD-10-CM | POA: Diagnosis not present

## 2023-07-31 DIAGNOSIS — M545 Low back pain, unspecified: Secondary | ICD-10-CM | POA: Diagnosis not present

## 2023-08-07 DIAGNOSIS — M533 Sacrococcygeal disorders, not elsewhere classified: Secondary | ICD-10-CM | POA: Diagnosis not present

## 2023-08-07 DIAGNOSIS — M545 Low back pain, unspecified: Secondary | ICD-10-CM | POA: Diagnosis not present

## 2023-08-07 DIAGNOSIS — H401131 Primary open-angle glaucoma, bilateral, mild stage: Secondary | ICD-10-CM | POA: Diagnosis not present

## 2023-08-10 ENCOUNTER — Encounter: Payer: Self-pay | Admitting: Nurse Practitioner

## 2023-08-10 ENCOUNTER — Ambulatory Visit: Payer: HMO | Admitting: Nurse Practitioner

## 2023-08-10 VITALS — BP 112/78 | HR 78 | Temp 97.9°F | Ht 64.0 in | Wt 234.4 lb

## 2023-08-10 DIAGNOSIS — E039 Hypothyroidism, unspecified: Secondary | ICD-10-CM | POA: Diagnosis not present

## 2023-08-10 DIAGNOSIS — I10 Essential (primary) hypertension: Secondary | ICD-10-CM

## 2023-08-10 NOTE — Progress Notes (Signed)
   Subjective:    Patient ID: Kristen Drake, female    DOB: 1955-04-09, 68 y.o.   MRN: [id number]  HPI Dents for routine follow-up on blood pressure and thyroid .  Adherent to medication regimen.  Is being followed by weight loss specialist in [place] .  Mainly eating protein and low carbs.  Also staying active.  Denies any lower extremity edema with use of HCTZ.   Review of Systems  HENT:  Negative for sore throat and trouble swallowing.   Respiratory:  Negative for cough, chest tightness, shortness of breath and wheezing.   Cardiovascular:  Negative for chest pain, palpitations and leg swelling.      08/10/2023    1:49 PM  Depression screen PHQ 2/9  Decreased Interest 0  Down, Depressed, Hopeless 0  PHQ - 2 Score 0  Altered sleeping 0  Tired, decreased energy 0  Change in appetite 0  Feeling bad or failure about yourself  0  Trouble concentrating 0  Moving slowly or fidgety/restless 0  Suicidal thoughts 0  PHQ-9 Score 0      08/10/2023    1:49 PM 06/30/2023    9:05 AM 04/27/2022    8:28 AM  GAD 7 : Generalized Anxiety Score  Nervous, Anxious, on Edge 0 0 0  Control/stop worrying 0 0 0  Worry too much - different things 0 0 0  Trouble relaxing 0 0 0  Restless 0 0 0  Easily annoyed or irritable 0 0 0  Afraid - awful might happen 0 0 0  Total GAD 7 Score 0 0 0  Anxiety Difficulty   Not difficult at all         Objective:   Physical Exam Vitals and nursing note reviewed.  Constitutional:      General: She is not in acute distress. Neck:     Comments: Thyroid  nontender to palpation, no mass or goiter noted. Cardiovascular:     Rate and Rhythm: Normal rate and regular rhythm.  Pulmonary:     Effort: Pulmonary effort is normal.     Breath sounds: Normal breath sounds.  Musculoskeletal:     Right lower leg: No edema.     Left lower leg: No edema.  Neurological:     Mental Status: She is alert.  Psychiatric:        Mood and Affect: Mood normal.         Behavior: Behavior normal.        Thought Content: Thought content normal.        Judgment: Judgment normal.    Today's Vitals   08/10/23 1339  BP: 112/78  Pulse: 78  Temp: 97.9 F (36.6 C)  SpO2: 97%  Weight: 234 lb 6.4 oz (106.3 kg)  Height: 5' 4 (1.626 m)   Body mass index is 40.23 kg/m.  06/30/2023: TSH 1.65.        Assessment & Plan:   Problem List Items Addressed This Visit       Cardiovascular and Mediastinum   Essential hypertension - Primary     Endocrine   Hypothyroidism   Continue current medications as directed. As patient loses weight, watch blood pressure in case lisinopril  needs to be decreased. Continue healthy diet and regular activity. Return in about 6 months (around 02/10/2024).

## 2023-08-15 DIAGNOSIS — M533 Sacrococcygeal disorders, not elsewhere classified: Secondary | ICD-10-CM | POA: Diagnosis not present

## 2023-08-15 DIAGNOSIS — M545 Low back pain, unspecified: Secondary | ICD-10-CM | POA: Diagnosis not present

## 2023-08-21 ENCOUNTER — Emergency Department (HOSPITAL_COMMUNITY)
Admission: EM | Admit: 2023-08-21 | Discharge: 2023-08-21 | Disposition: A | Discharge status: Home / Self Care | Attending: Emergency Medicine | Admitting: Emergency Medicine

## 2023-08-21 ENCOUNTER — Emergency Department (HOSPITAL_COMMUNITY)

## 2023-08-21 ENCOUNTER — Encounter (HOSPITAL_COMMUNITY): Payer: Self-pay | Admitting: Emergency Medicine

## 2023-08-21 DIAGNOSIS — E039 Hypothyroidism, unspecified: Secondary | ICD-10-CM | POA: Insufficient documentation

## 2023-08-21 DIAGNOSIS — W293XXA Contact with powered garden and outdoor hand tools and machinery, initial encounter: Secondary | ICD-10-CM | POA: Insufficient documentation

## 2023-08-21 DIAGNOSIS — S62661B Nondisplaced fracture of distal phalanx of left index finger, initial encounter for open fracture: Secondary | ICD-10-CM | POA: Diagnosis not present

## 2023-08-21 DIAGNOSIS — I10 Essential (primary) hypertension: Secondary | ICD-10-CM | POA: Diagnosis not present

## 2023-08-21 DIAGNOSIS — S6992XA Unspecified injury of left wrist, hand and finger(s), initial encounter: Secondary | ICD-10-CM | POA: Diagnosis not present

## 2023-08-21 DIAGNOSIS — S61211A Laceration without foreign body of left index finger without damage to nail, initial encounter: Secondary | ICD-10-CM | POA: Diagnosis not present

## 2023-08-21 DIAGNOSIS — Z79899 Other long term (current) drug therapy: Secondary | ICD-10-CM | POA: Insufficient documentation

## 2023-08-21 DIAGNOSIS — S61311A Laceration without foreign body of left index finger with damage to nail, initial encounter: Secondary | ICD-10-CM

## 2023-08-21 MED ORDER — MORPHINE SULFATE (PF) 4 MG/ML IV SOLN
6.0000 mg | Freq: Once | INTRAVENOUS | Status: AC
  Administered 2023-08-21: 6 mg via INTRAVENOUS
  Filled 2023-08-21: qty 2

## 2023-08-21 MED ORDER — CEPHALEXIN 500 MG PO CAPS: 500.0000 mg | ORAL_CAPSULE | Freq: Four times a day (QID) | ORAL | 0 refills | Status: DC

## 2023-08-21 MED ORDER — OXYCODONE HCL 5 MG PO TABS
5.0000 mg | ORAL_TABLET | Freq: Once | ORAL | Status: AC
  Administered 2023-08-21: 5 mg via ORAL
  Filled 2023-08-21: qty 1

## 2023-08-21 MED ORDER — ONDANSETRON HCL 4 MG/2ML IJ SOLN
4.0000 mg | Freq: Once | INTRAMUSCULAR | Status: AC
  Administered 2023-08-21: 4 mg via INTRAVENOUS
  Filled 2023-08-21: qty 2

## 2023-08-21 MED ORDER — CEFAZOLIN SODIUM 1 G IJ SOLR
2.0000 g | Freq: Once | INTRAMUSCULAR | Status: AC
  Administered 2023-08-21: 2 g via INTRAMUSCULAR
  Filled 2023-08-21: qty 20

## 2023-08-21 MED ORDER — OXYCODONE HCL 5 MG PO TABS: 5.0000 mg | ORAL_TABLET | ORAL | 0 refills | Status: DC | PRN

## 2023-08-21 MED ORDER — LIDOCAINE HCL (PF) 1 % IJ SOLN
5.0000 mL | Freq: Once | INTRAMUSCULAR | Status: AC
  Administered 2023-08-21: 5 mL
  Filled 2023-08-21: qty 5

## 2023-08-21 NOTE — ED Notes (Signed)
 Upon entering patient's room for discharge, bleeding noted through dressing. Unwrapped patient's finger and minimal bleeding at this time. ED provider made aware; continue to monitor bleeding prior to discharge

## 2023-08-21 NOTE — ED Notes (Signed)
 ED Provider at bedside.

## 2023-08-21 NOTE — ED Notes (Signed)
 ED Provider at bedside. Hemostatic dressing applied to site, followed by bulk and pressure dressing. Continue monitoring bleeding

## 2023-08-21 NOTE — ED Notes (Signed)
 Nonstick dressing, pressure dressing, and finger splint applied to patient's L index finger.

## 2023-08-21 NOTE — ED Triage Notes (Signed)
 Cut LT index finger with hedge cutter, blood spurting out the end of wound

## 2023-08-21 NOTE — Discharge Instructions (Addendum)
 Please keep your wound clean and dry.  You may clean it daily with soapy water.  Prescription for antibiotics has been sent into your pharmacy.  Please complete the full course of antibiotics.  Otherwise commend alternating Tylenol  and ibuprofen as needed for pain and take the oxycodone  for breakthrough pain.  Please call the number on the sheet to follow-up with orthopedic doctor.

## 2023-08-21 NOTE — ED Provider Notes (Signed)
 [place] EMERGENCY DEPARTMENT AT [facility] Provider Note   CSN: [id number] Arrival date & time: 08/21/23  1627     Patient presents with: Finger Injury   Kristen Drake is a 68 y.o. female history of hypertension, hypothyroidism presents with complaints of finger injury.  States she was using a Counsellor when she cut her finger.  She is up-to-date on her tetanus.  Endorses numbness and tingling to the distal aspect of the finger.   HPI    Past Medical History:  Diagnosis Date   Hypertension    Hypothyroid      Prior to Admission medications   Medication Sig Start Date End Date Taking? Authorizing Provider  albuterol  (VENTOLIN  HFA) 108 (90 Base) MCG/ACT inhaler Inhale 2 puffs into the lungs every 6 (six) hours as needed for wheezing or shortness of breath. 02/11/23   Mauro Elveria BROCKS, NP  hydrochlorothiazide  (HYDRODIURIL ) 25 MG tablet TAKE ONE TABLET BY MOUTH EVERY MORNING 06/13/23   Luking, Glendia LABOR, MD  latanoprost (XALATAN) 0.005 % ophthalmic solution 1 drop at bedtime. 11/10/20   [provider]  levothyroxine  (SYNTHROID ) 88 MCG tablet TAKE ONE TABLET BY MOUTH EVERY MORNING 07/19/23   Kristen Glendia LABOR, MD  lisinopril  (ZESTRIL ) 5 MG tablet TAKE ONE TABLET (5MG  TOTAL) BY MOUTH DAILY FOR BLOOD PRESSURE 03/27/23   Kristen Glendia LABOR, MD  naproxen sodium (ANAPROX) 220 MG tablet Take 220 mg by mouth 2 (two) times daily with a meal.    [provider]  [facility] LOW DOSE 0.5-2.5 MG-MCG per tablet  03/01/13 09/19/18  [provider]    Allergies: Patient has no known allergies.    Review of Systems  Skin:  Positive for wound.    Updated Vital Signs BP (!) 169/91   Pulse 79   Temp 98.1 F (36.7 C) (Oral)   Resp 18   Ht 5' 4 (1.626 m)   Wt 104.3 kg   LMP 06/28/2010   SpO2 97%   BMI 39.48 kg/m   Physical Exam Vitals and nursing note reviewed.  Constitutional:      General: She is not in acute distress.    Appearance: She is  well-developed.  HENT:     Head: Normocephalic and atraumatic.  Eyes:     Conjunctiva/sclera: Conjunctivae normal.  Cardiovascular:     Pulses: Normal pulses.     Heart sounds: No murmur heard. Pulmonary:     Effort: Pulmonary effort is normal. No respiratory distress.  Musculoskeletal:     Cervical back: Neck supple.     Comments: Laceration noted to the distal aspect of the left index finger.  The nail is predominantly avulsed.  There is pulsatile bleeding, loss of distal sensation, tolerates full range of motion of the digit, radial pulse 2+  Skin:    General: Skin is warm and dry.     Capillary Refill: Capillary refill takes less than 2 seconds.  Neurological:     Mental Status: She is alert.  Psychiatric:        Mood and Affect: Mood normal.     (all labs ordered are listed, but only abnormal results are displayed) Labs Reviewed - No data to display  EKG: None  Radiology: DG Finger Index Left Result Date: 08/21/2023 CLINICAL DATA:  Hedge cutter injury of the index finger EXAM: LEFT INDEX FINGER 2+V COMPARISON:  None Available. FINDINGS: Amputation of the index finger at the level of the distal phalangeal distal metaphysis. Extensive surrounding bandaging.  Adjacent middle finger appears intact. IMPRESSION: 1. Amputation of the index finger at the level of the distal phalangeal distal metaphysis. Electronically Signed   By: Ryan Salvage M.D.   On: 08/21/2023 17:50     .Laceration Repair  Date/Time: 08/21/2023 7:51 PM  Performed by: Donnajean Lynwood DEL, PA-C Authorized by: Donnajean Lynwood DEL, PA-C   Consent:    Consent obtained:  Verbal   Consent given by:  Patient   Risks discussed:  Infection and pain   Alternatives discussed:  No treatment Universal protocol:    Procedure explained and questions answered to patient or proxy's satisfaction: yes     Patient identity confirmed:  Verbally with patient Anesthesia:    Anesthesia method:  Local infiltration    Local anesthetic:  Lidocaine  1% w/o epi Laceration details:    Location:  Finger   Finger location:  L index finger   Length (cm):  2 Exploration:    Hemostasis achieved with:  Cautery, tourniquet and direct pressure   Wound extent: underlying fracture   Treatment:    Area cleansed with:  Povidone-iodine, saline and soap and water   Amount of cleaning:  Extensive   Irrigation solution:  Sterile saline Skin repair:    Repair method:  Sutures and tissue adhesive   Suture size:  4-0   Suture material:  Prolene   Suture technique:  Simple interrupted   Number of sutures:  5 Approximation:    Approximation:  Loose Post-procedure details:    Procedure completion:  Tolerated    Medications Ordered in the ED  ceFAZolin  (ANCEF ) injection 2 g (2 g Intramuscular Given 08/21/23 1752)  morphine  (PF) 4 MG/ML injection 6 mg (6 mg Intravenous Given 08/21/23 1753)  ondansetron  (ZOFRAN ) injection 4 mg (4 mg Intravenous Given 08/21/23 1752)  lidocaine  (PF) (XYLOCAINE ) 1 % injection 5 mL (5 mLs Other Given by Other 08/21/23 1849)                                    Medical Decision Making Amount and/or Complexity of Data Reviewed Radiology: ordered.  Risk Prescription drug management.   This patient presents to the ED with chief complaint(s) of finger injury.  The complaint involves an extensive differential diagnosis and also carries with it a high risk of complications and morbidity.   Pertinent past medical history as listed in HPI  The differential diagnosis includes  Simple laceration, fracture or dislocation Additional history obtained: Records reviewed Care Everywhere/External Records  Assessment and management:   Hemodynamically stable patient presenting with complaints of finger injury from a hedge tremor.  On exam there is an obvious laceration noted to the distal aspect of the left finger, the nail is predominantly avulsed, pulsatile bleeding is noted.  Appears to have a  amputation at the level of the distal phalangeal distal metaphysis.  Ancef  given. Hemostasis achieved with sutures, cautery and Dermabond.  Bulky dressing applied with overlying splint.  Will provide close Ortho follow-up.  Prescription for Keflex  and oxycodone  sent to pharmacy.  Independent ECG interpretation:  none  Independent labs interpretation:  The following labs were independently interpreted:  none  Independent visualization and interpretation of imaging: I independently visualized the following imaging with scope of interpretation limited to determining acute life threatening conditions related to emergency care:  Finger x-ray demonstrate amputation of the index finger at the level of the distal phalangeal distal metaphysis   Consultations  obtained:   Hand surgery Dr.Mark Onesimo recommended dressing, finger splint and close outpatient follow-up  Disposition:   Patient is alert and oriented. There is no abnormal phonation. Symmetric smile without facial droop. No pronator drift. Moves all extremities spontaneously. 5/5 strength in upper and lower extremities. . No sensation deficit. There is no nystagmus. EOMI, PERRL. Coordination intact with finger to nose and normal ambulation.    Social Determinants of Health:   none  This note was dictated with voice recognition software.  Despite best efforts at proofreading, errors may have occurred which can change the documentation meaning.       Final diagnoses:  Laceration of left index finger without foreign body with damage to nail, initial encounter  Open nondisplaced fracture of distal phalanx of left index finger, initial encounter    ED Discharge Orders     None          Donnajean Lynwood VEAR DEVONNA 08/21/23 2000    Melvenia Motto, MD 08/21/23 2308

## 2023-08-22 ENCOUNTER — Encounter: Payer: Self-pay | Admitting: Orthopedic Surgery

## 2023-08-22 ENCOUNTER — Ambulatory Visit: Admitting: Orthopedic Surgery

## 2023-08-22 VITALS — BP 128/88 | Ht 64.0 in | Wt 230.0 lb

## 2023-08-22 DIAGNOSIS — S68119A Complete traumatic metacarpophalangeal amputation of unspecified finger, initial encounter: Secondary | ICD-10-CM

## 2023-08-22 NOTE — Progress Notes (Signed)
 New Patient Visit  Assessment: Kristen Drake is a 68 y.o. female with the following: 1. Amputation of tip of finger, initial encounter  Plan: SHARNIKA BINNEY amputated the distal tip of the left index finger while using hedge tremors yesterday.  She was evaluated in the emergency department.  Multiple sutures were placed.  On exam today, there is no exposed bone.  Sutures are intact.  No active bleeding.  Based on radiographs, she did lose the distal extent of the distal phalanx.  She will continue with antibiotics.  Daily dressing changes.  As long as she does not develop an infection, and the skin heals over the bone, we can avoid a revision amputation.  This was discussed with the patient.  She states understanding.  I would like see her back in 1 week for a wound check.  Follow-up: Return in about 1 week (around 08/29/2023).  Subjective:  Chief Complaint  Patient presents with   Finger Injury    Yesterday finger tip left vs hedge trimmer     History of Present Illness: Kristen Drake is a 68 y.o. female who presents for evaluation of left hand pain.  She was working at home yesterday, using hedge tremors.  She caught her left index finger with hedge tremor.  There was a lot of bleeding.  She presented to emergency department.  Radiographs demonstrated amputation of the distal portion of the distal phalanx.  Bleeding was stopped.  The wound was irrigated and closed with multiple sutures.  She presents today for follow-up.  She was given antibiotics in the emergency department.  She was also provided with a prescription.   Review of Systems: No fevers or chills No numbness or tingling No chest pain No shortness of breath No bowel or bladder dysfunction No GI distress No headaches   Medical History:  Past Medical History:  Diagnosis Date   Hypertension    Hypothyroid     Past Surgical History:  Procedure Laterality Date   ORTHOPEDIC SURGERY Right 07/09/2020   R foot bunion,  hammer toe repair    Family History  Problem Relation Age of Onset   Breast cancer Neg Hx    BRCA 1/2 Neg Hx    Social History   Tobacco Use   Smoking status: Never   Smokeless tobacco: Never  Substance Use Topics   Alcohol use: Not Currently   Drug use: Never    No Known Allergies  No outpatient medications have been marked as taking for the 08/22/23 encounter (Office Visit) with Onesimo Oneil LABOR, MD.    Objective: BP 128/88 Comment: yesterday  Ht 5' 4 (1.626 m)   Wt 230 lb (104.3 kg)   LMP 06/28/2010   BMI 39.48 kg/m   Physical Exam:  General: Alert and oriented. and No acute distress. Gait: Normal gait.  Left hand with a laceration to the distal extent of the index finger.  No active bleeding.  No exposed bone.  Transverse laceration through the base of the nailbed, with just a little bit of the nail fold remaining.    IMAGING: I personally reviewed images previously obtained from the ED  X-ray obtained in the emergency department demonstrates amputation of the distal extent of the distal phalanx.   New Medications:  No orders of the defined types were placed in this encounter.     Oneil LABOR Onesimo, MD  08/22/2023 11:23 AM

## 2023-08-29 ENCOUNTER — Ambulatory Visit: Admitting: Orthopedic Surgery

## 2023-08-29 ENCOUNTER — Encounter: Payer: Self-pay | Admitting: Orthopedic Surgery

## 2023-08-29 DIAGNOSIS — S68111D Complete traumatic metacarpophalangeal amputation of left index finger, subsequent encounter: Secondary | ICD-10-CM

## 2023-08-29 DIAGNOSIS — S68119D Complete traumatic metacarpophalangeal amputation of unspecified finger, subsequent encounter: Secondary | ICD-10-CM

## 2023-08-29 DIAGNOSIS — Z800 Family history of malignant neoplasm of digestive organs: Secondary | ICD-10-CM | POA: Insufficient documentation

## 2023-08-29 NOTE — Progress Notes (Signed)
 Return patient Visit  Assessment: Kristen Drake is a 68 y.o. female with the following: 1. Amputation of tip of finger, subsequent encounter  Plan: Kristen Drake amputated the distal tip of the left index finger while using hedge clippers.  We have been monitoring this closely.  She has been completing daily dressing changes.  Overall, the wound is healing very well.  Sutures were removed.  There is healthy appearing granulation tissue.  No exposed bone.  Continue with nonoperative management.  I would like to see her in a week.  She has completed antibiotics, but will update us  if she starts to have any symptoms.  Follow-up: Return in about 1 week (around 09/05/2023).  Subjective:  Chief Complaint  Patient presents with   Finger Injury    Left index 08/21/23 sutures removed without difficulty patient tolerated well     History of Present Illness: Kristen Drake is a 69 y.o. female who returns for evaluation of left hand pain.  She injured her left index finger about a week ago.  Partial amputation.  She has completed antibiotics.  Limited pain.  She is completing daily dressing changes without issues.  No fevers or chills.  Review of Systems: No fevers or chills No numbness or tingling No chest pain No shortness of breath No bowel or bladder dysfunction No GI distress No headaches      Objective: LMP 06/28/2010   Physical Exam:  General: Alert and oriented. and No acute distress. Gait: Normal gait.  Left hand with a laceration to the distal extent of the index finger.  No active bleeding.  No exposed bone.  Transverse laceration through the base of the nailbed, with just a little bit of the nail fold remaining.  Healthy appearing granulation tissue over the tip.  Sutures are ready to be removed.  She is able to flex the PIP, and some limited flexion of the DIP joint.    IMAGING: No new imaging obtained today    New Medications:  No orders of the defined types were  placed in this encounter.     [name], MD  08/29/2023 10:29 AM

## 2023-08-30 DIAGNOSIS — M533 Sacrococcygeal disorders, not elsewhere classified: Secondary | ICD-10-CM | POA: Diagnosis not present

## 2023-08-30 DIAGNOSIS — M545 Low back pain, unspecified: Secondary | ICD-10-CM | POA: Diagnosis not present

## 2023-09-05 ENCOUNTER — Ambulatory Visit: Admitting: Orthopedic Surgery

## 2023-09-06 ENCOUNTER — Ambulatory Visit: Admitting: Orthopaedic Surgery

## 2023-09-06 ENCOUNTER — Encounter: Payer: Self-pay | Admitting: Orthopaedic Surgery

## 2023-09-06 ENCOUNTER — Other Ambulatory Visit (INDEPENDENT_AMBULATORY_CARE_PROVIDER_SITE_OTHER)

## 2023-09-06 DIAGNOSIS — M545 Low back pain, unspecified: Secondary | ICD-10-CM | POA: Diagnosis not present

## 2023-09-06 DIAGNOSIS — S68119D Complete traumatic metacarpophalangeal amputation of unspecified finger, subsequent encounter: Secondary | ICD-10-CM | POA: Diagnosis not present

## 2023-09-06 DIAGNOSIS — S68111D Complete traumatic metacarpophalangeal amputation of left index finger, subsequent encounter: Secondary | ICD-10-CM

## 2023-09-06 DIAGNOSIS — M533 Sacrococcygeal disorders, not elsewhere classified: Secondary | ICD-10-CM | POA: Diagnosis not present

## 2023-09-06 NOTE — Progress Notes (Signed)
 Patient of Dr. Onesimo.  She has amputation of the tip of the left index finger.    Wound looks good, no discharge.  Encounter Diagnosis  Name Primary?   Amputation of tip of finger, subsequent encounter Yes   I have taken X-rays of the finger, reported separately.  I have shown them to the patient.  I have told her about scarlet red and she is considering it.  New dressing applied.  Return in one week.  Call if any problem.  Precautions discussed.  Electronically Signed Lemond Stable, MD 8/13/202510:15 AM

## 2023-09-13 ENCOUNTER — Ambulatory Visit (INDEPENDENT_AMBULATORY_CARE_PROVIDER_SITE_OTHER): Admitting: Orthopaedic Surgery

## 2023-09-13 ENCOUNTER — Encounter: Payer: Self-pay | Admitting: Orthopaedic Surgery

## 2023-09-13 DIAGNOSIS — S68119D Complete traumatic metacarpophalangeal amputation of unspecified finger, subsequent encounter: Secondary | ICD-10-CM | POA: Diagnosis not present

## 2023-09-13 NOTE — Progress Notes (Signed)
 My finger is not hurting.  She has been using the scarlet red solution and also the xeroform.  Her finger tip looks good but has a piece of granulation tissue that is almost loose.  She has no drainage.  I removed the excess granulation tissue by sterile technique.  Encounter Diagnosis  Name Primary?   Amputation of tip of finger, subsequent encounter Yes   Continue current management.  Return in one week.  New dressing applied.  Call if any problem.  Precautions discussed.  Electronically Signed Lemond Stable, MD 8/20/20258:46 AM

## 2023-09-15 ENCOUNTER — Encounter: Payer: Self-pay | Admitting: Radiology

## 2023-09-20 ENCOUNTER — Ambulatory Visit (INDEPENDENT_AMBULATORY_CARE_PROVIDER_SITE_OTHER): Admitting: Orthopaedic Surgery

## 2023-09-20 ENCOUNTER — Encounter: Payer: Self-pay | Admitting: Orthopaedic Surgery

## 2023-09-20 DIAGNOSIS — S68119D Complete traumatic metacarpophalangeal amputation of unspecified finger, subsequent encounter: Secondary | ICD-10-CM

## 2023-09-20 DIAGNOSIS — M533 Sacrococcygeal disorders, not elsewhere classified: Secondary | ICD-10-CM | POA: Diagnosis not present

## 2023-09-20 DIAGNOSIS — M545 Low back pain, unspecified: Secondary | ICD-10-CM | POA: Diagnosis not present

## 2023-09-20 NOTE — Progress Notes (Signed)
 It looks better.  The finger tip amputation on the left index finger looks good.  Some granulation tissue is present. She is using the scarlet red solution.  I told her to use some peroxide before the scarlet red.  The finger tip was redress.  Return to see Dr. Onesimo in two weeks.  Encounter Diagnosis  Name Primary?   Amputation of tip of finger, subsequent encounter Yes   Call if any problem.  Precautions discussed.  Electronically Signed Lemond Stable, MD 8/27/20253:23 PM

## 2023-09-21 ENCOUNTER — Other Ambulatory Visit: Payer: Self-pay | Admitting: Family Medicine

## 2023-09-27 DIAGNOSIS — M545 Low back pain, unspecified: Secondary | ICD-10-CM | POA: Diagnosis not present

## 2023-09-27 DIAGNOSIS — M533 Sacrococcygeal disorders, not elsewhere classified: Secondary | ICD-10-CM | POA: Diagnosis not present

## 2023-10-04 ENCOUNTER — Encounter: Payer: Self-pay | Admitting: Orthopedic Surgery

## 2023-10-04 ENCOUNTER — Ambulatory Visit (INDEPENDENT_AMBULATORY_CARE_PROVIDER_SITE_OTHER): Admitting: Orthopedic Surgery

## 2023-10-04 DIAGNOSIS — S68119D Complete traumatic metacarpophalangeal amputation of unspecified finger, subsequent encounter: Secondary | ICD-10-CM

## 2023-10-04 NOTE — Progress Notes (Signed)
 Return patient Visit  Assessment: Kristen Drake is a 68 y.o. female with the following: 1. Amputation of tip of finger, subsequent encounter  Plan: Kristen Drake amputated the distal tip of the left index finger while using hedge clippers.  Injury was sustained approximately 6 weeks ago.  She is healing the laceration well.  Granulation tissue is formed over the tip of the finger.  Continue with dressing changes.  Keep the finger clean.  I would like see her back in 2 weeks.  At that point, we may allow her to stop with the dressing changes.  I have also encouraged her to be more aggressive with working on her range of motion.   Follow-up: Return in about 2 weeks (around 10/18/2023).  Subjective:  Chief Complaint  Patient presents with   Routine Post Op   Hand Injury    Ambutation of left index finger    History of Present Illness: Kristen Drake is a 68 y.o. female who returns for evaluation of left hand pain.  She injured her left index finger approximately 6 weeks ago.  Partial amputation.  She has been continuing with dressing changes.  Recently, she has been using [name], and Xeroform.  No fevers or chills.  She denies pain.  She is developing stiffness in the index finger.   Review of Systems: No fevers or chills No numbness or tingling No chest pain No shortness of breath No bowel or bladder dysfunction No GI distress No headaches      Objective: LMP 06/28/2010   Physical Exam:  General: Alert and oriented. and No acute distress. Gait: Normal gait.  Left hand with a laceration to the distal extent of the index finger.  Healthy granulation tissue appearing over the distal tip.  No purulence.  She has stiffness at the PIP, as well as the MCP joint of the index finger.  Unable to make a full fist.  IMAGING: No new imaging obtained today   Recent radiographs demonstrates soft tissue coverage of the partial amputation of the distal phalanx of the left index  finger.    New Medications:  No orders of the defined types were placed in this encounter.     Kristen [name], MD  10/04/2023 10:43 AM

## 2023-10-18 ENCOUNTER — Ambulatory Visit: Admitting: Orthopedic Surgery

## 2023-10-18 ENCOUNTER — Encounter: Payer: Self-pay | Admitting: Orthopedic Surgery

## 2023-10-18 DIAGNOSIS — S68119D Complete traumatic metacarpophalangeal amputation of unspecified finger, subsequent encounter: Secondary | ICD-10-CM

## 2023-10-18 DIAGNOSIS — S68111D Complete traumatic metacarpophalangeal amputation of left index finger, subsequent encounter: Secondary | ICD-10-CM

## 2023-10-18 NOTE — Progress Notes (Signed)
 Return patient Visit  Assessment: Kristen Drake is a 68 y.o. female with the following: 1. Amputation of tip of finger, subsequent encounter  Plan: TIANA SIVERTSON amputated the distal tip of the left index finger while using hedge clippers.  Injury was sustained approximately 2 months ago.  She is doing much better.  Her nail is starting to grow back.  She may be developing some excess keratin deposition, but we can monitor this in the future.  She does have some slight sensitivity to the tip of the finger.  She is lacking a little bit of motion at the DIP joint.  Continue to work on motion.  Keep the finger clean.  I would like see her back in 1 month for final evaluation.  Follow-up: Return in about 4 weeks (around 11/15/2023).  Subjective:  Chief Complaint  Patient presents with   Wound Check    L hand index finger DOI 08/21/23    History of Present Illness: Kristen Drake is a 68 y.o. female who returns for evaluation of left hand pain.  She injured her left index finger approximately 2 months ago.  Partial amputation.  She is no longer continue with regular dressing changes.  She comes her finger with a Band-Aid as needed.  She notes that the nail has started to grow back.  She is already on motion, and notes improved mobility and function.  She is still lacking some range of motion.  Review of Systems: No fevers or chills No numbness or tingling No chest pain No shortness of breath No bowel or bladder dysfunction No GI distress No headaches      Objective: LMP 06/28/2010   Physical Exam:  General: Alert and oriented. and No acute distress. Gait: Normal gait.  Left hand with partial amputation of the distal aspect of the index finger.  This has healed and epithelialized.  There may be excess keratin being deposited.  Nail is starting to grow back.  Mild sensitivity to touch distally.  She is lacking some range of motion of the DIP joint.  IMAGING: No new imaging obtained  today   Recent radiographs demonstrates soft tissue coverage of the partial amputation of the distal phalanx of the left index finger.    New Medications:  No orders of the defined types were placed in this encounter.     [name], MD  10/18/2023 3:12 PM

## 2023-11-15 ENCOUNTER — Encounter: Payer: Self-pay | Admitting: Orthopedic Surgery

## 2023-11-15 ENCOUNTER — Ambulatory Visit: Admitting: Orthopedic Surgery

## 2023-11-15 DIAGNOSIS — S68119D Complete traumatic metacarpophalangeal amputation of unspecified finger, subsequent encounter: Secondary | ICD-10-CM | POA: Diagnosis not present

## 2023-11-18 NOTE — Progress Notes (Signed)
 Return patient Visit  Assessment: Kristen Drake is a 68 y.o. female with the following: 1. Amputation of tip of finger, subsequent encounter  Plan: Kristen Drake amputated the distal tip of the left index finger while using hedge clippers.  Injury occurred several months ago.  She has done well.  No pain.  ROM improving.  Nail is growing appropriately.  She is pleased with her improvement.  Follow up as needed.   Follow-up: Return if symptoms worsen or fail to improve.  Subjective:  Chief Complaint  Patient presents with   Finger Injury    Better/patient states she is doing well s/p left index distal tip amputation     History of Present Illness: Kristen Drake is a 67 y.o. female who returns for evaluation of left hand pain.  She injured her left index finger approximately 3 months ago.  Partial amputation.  No pain.  She has trimmed the excess skin and her nail is growing appropriately.   Review of Systems: No fevers or chills No numbness or tingling No chest pain No shortness of breath No bowel or bladder dysfunction No GI distress No headaches      Objective: LMP 06/28/2010   Physical Exam:  General: Alert and oriented. and No acute distress. Gait: Normal gait.  Left hand with partial amputation of the distal aspect of the index finger. Excess skin and keratin has been removed.  Mild tenderness distally.  Nail is growing well. No bone is exposed.   IMAGING: No new imaging obtained today     New Medications:  No orders of the defined types were placed in this encounter.     Kristen [name], MD  11/18/2023 12:13 AM

## 2023-11-27 ENCOUNTER — Encounter: Payer: Self-pay | Admitting: Radiology

## 2023-12-14 ENCOUNTER — Other Ambulatory Visit: Payer: Self-pay | Admitting: Family Medicine

## 2024-01-11 ENCOUNTER — Ambulatory Visit: Admitting: Nurse Practitioner

## 2024-01-11 VITALS — BP 130/83 | HR 65 | Temp 98.1°F | Ht 64.0 in | Wt 241.0 lb

## 2024-01-11 DIAGNOSIS — Z6841 Body Mass Index (BMI) 40.0 and over, adult: Secondary | ICD-10-CM

## 2024-01-11 DIAGNOSIS — Z23 Encounter for immunization: Secondary | ICD-10-CM

## 2024-01-11 DIAGNOSIS — E063 Autoimmune thyroiditis: Secondary | ICD-10-CM | POA: Diagnosis not present

## 2024-01-11 DIAGNOSIS — B372 Candidiasis of skin and nail: Secondary | ICD-10-CM

## 2024-01-11 DIAGNOSIS — I10 Essential (primary) hypertension: Secondary | ICD-10-CM | POA: Diagnosis not present

## 2024-01-11 MED ORDER — LEVOTHYROXINE SODIUM 88 MCG PO TABS: 88.0000 ug | ORAL_TABLET | Freq: Every morning | ORAL | 1 refills | Status: AC

## 2024-01-11 MED ORDER — NYSTATIN-TRIAMCINOLONE 100000-0.1 UNIT/GM-% EX CREA: 1.0000 | TOPICAL_CREAM | Freq: Two times a day (BID) | CUTANEOUS | 0 refills | Status: AC

## 2024-01-11 NOTE — Patient Instructions (Signed)
 Contrave

## 2024-01-13 ENCOUNTER — Encounter: Payer: Self-pay | Admitting: Nurse Practitioner

## 2024-01-13 DIAGNOSIS — B372 Candidiasis of skin and nail: Secondary | ICD-10-CM | POA: Insufficient documentation

## 2024-01-13 NOTE — Progress Notes (Signed)
 "  Subjective:    Patient ID: Kristen Drake, female    DOB: Nov 10, 1955, 68 y.o.   MRN: [id number]  HPI Discussed the use of AI scribe software for clinical note transcription with the patient, who gave verbal consent to proceed.  History of Present Illness Kristen Drake is a 68 year old female with hypertension and hypothyroidism who presents for a routine follow-up visit.  She uses Mycolog cream rarely for external genital itching, but her current supply has expired. She has been using this medication for a long time and finds a smaller tube sufficient for her needs.  Her blood pressure is treated with hydrochlorothiazide  and lisinopril . She monitors her blood pressure at home but sometimes forgets to check it regularly. She is due for a refill of levothyroxine , which is managed through her pharmacy.  She has a history of a simple benign cyst on her left kidney, discovered during an MRI for her back. The cyst was an incidental finding. Previous imaging from two years ago also identified this as a simple benign cyst on her left kidney with no further work up indicated.   She discusses her weight, attributing recent gain to the holidays and a love for eating. She recalls losing weight previously on a very restricted diet of 700-800 calories per day. She acknowledges the need to start exercising again.  No breathing issues, chest pain, shortness of breath, coughing, or wheezing. She has not had thyroid  surgery or radiation, only thyroiditis. No difficulty swallowing or sore throats.  Edema LE has improved with use of hydrochlorothiazide .   She has not received the COVID vaccine recently and is considering the shingles vaccine.     01/11/2024    2:08 PM  Depression screen PHQ 2/9  Decreased Interest 0  Down, Depressed, Hopeless 0  PHQ - 2 Score 0  Altered sleeping 0  Tired, decreased energy 0  Change in appetite 0  Feeling bad or failure about yourself  0  Trouble concentrating 0   Moving slowly or fidgety/restless 0  Suicidal thoughts 0  PHQ-9 Score 0  Difficult doing work/chores Not difficult at all      01/11/2024    2:08 PM 08/10/2023    1:49 PM 06/30/2023    9:05 AM 04/27/2022    8:28 AM  GAD 7 : Generalized Anxiety Score  Nervous, Anxious, on Edge 0 0 0 0  Control/stop worrying 0 0 0 0  Worry too much - different things 0 0 0 0  Trouble relaxing 0 0 0 0  Restless 0 0 0 0  Easily annoyed or irritable 0 0 0 0  Afraid - awful might happen 0 0 0 0  Total GAD 7 Score 0 0 0 0  Anxiety Difficulty Not difficult at all   Not difficult at all    Social History[1]      Objective:   Physical Exam Vitals and nursing note reviewed.  Constitutional:      General: She is not in acute distress. Neck:     Comments: Thyroid  non tender to palpation. No mass or goiter noted.  Cardiovascular:     Rate and Rhythm: Normal rate and regular rhythm.     Heart sounds: Normal heart sounds.  Pulmonary:     Effort: Pulmonary effort is normal.     Breath sounds: Normal breath sounds.  Musculoskeletal:     Cervical back: Neck supple.  Skin:    General: Skin is warm and dry.  Neurological:  Mental Status: She is alert and oriented to person, place, and time.  Psychiatric:        Mood and Affect: Mood normal.        Behavior: Behavior normal.        Thought Content: Thought content normal.    Today's Vitals   01/11/24 1406  BP: 130/83  Pulse: 65  Temp: 98.1 F (36.7 C)  SpO2: 100%  Weight: 241 lb (109.3 kg)  Height: 5' 4 (1.626 m)   Body mass index is 41.37 kg/m.        Assessment & Plan:  1. Essential hypertension (Primary) Blood pressure well-managed with current medication. Advised home monitoring. - Continue hydrochlorothiazide  and lisinopril . - Advise home blood pressure monitoring.  2. Hypothyroidism due to Hashimoto thyroiditis TSH stable. Continue same dose of medication.  - levothyroxine  (SYNTHROID ) 88 MCG tablet; Take 1 tablet (88 mcg  total) by mouth every morning.  Dispense: 90 tablet; Refill: 1  3. Morbid obesity (HCC) Discussed Contrave for weight loss. Explained potential side effects and cost. Advised to check pharmacy discount program. Has no interest in GLP-1 at this time.   4. Immunization due  - Flu vaccine HIGH DOSE PF(Fluzone Trivalent)  5. Yeast dermatitis  - nystatin -triamcinolone  (MYCOLOG II) cream; Apply 1 Application topically 2 (two) times daily. Prn rash  Dispense: 15 g; Refill: 0  - Administer high-dose flu shot. - Consider shingles vaccine. - Schedule colonoscopy for next year. - Schedule mammogram for May. Recommend physical and labs in June 2026.  Return in about 6 months (around 07/11/2024).         [1]  Social History Tobacco Use   Smoking status: Never   Smokeless tobacco: Never  Substance Use Topics   Alcohol use: Not Currently   Drug use: Never   "

## 2024-05-10 ENCOUNTER — Ambulatory Visit

## 2024-07-11 ENCOUNTER — Ambulatory Visit: Admitting: Nurse Practitioner
